# Patient Record
Sex: Female | Born: 1985 | Race: White | Hispanic: No | Marital: Married | Smoking: Never smoker
Health system: Southern US, Community
[De-identification: ages and names within clinical notes are randomized; demographics above are authoritative.]

## PROBLEM LIST (undated history)

## (undated) DIAGNOSIS — K469 Unspecified abdominal hernia without obstruction or gangrene: Secondary | ICD-10-CM

## (undated) DIAGNOSIS — G43909 Migraine, unspecified, not intractable, without status migrainosus: Secondary | ICD-10-CM

## (undated) DIAGNOSIS — R51 Headache: Secondary | ICD-10-CM

## (undated) DIAGNOSIS — R519 Headache, unspecified: Secondary | ICD-10-CM

## (undated) DIAGNOSIS — K219 Gastro-esophageal reflux disease without esophagitis: Secondary | ICD-10-CM

## (undated) HISTORY — DX: Unspecified abdominal hernia without obstruction or gangrene: K46.9

## (undated) HISTORY — DX: Gastro-esophageal reflux disease without esophagitis: K21.9

---

## 2011-11-01 ENCOUNTER — Emergency Department: Payer: Self-pay | Admitting: Emergency Medicine

## 2011-11-01 LAB — COMPREHENSIVE METABOLIC PANEL
Albumin: 3.6 g/dL (ref 3.4–5.0)
Alkaline Phosphatase: 78 U/L (ref 50–136)
Anion Gap: 13 (ref 7–16)
BUN: 7 mg/dL (ref 7–18)
Bilirubin,Total: 0.3 mg/dL (ref 0.2–1.0)
Calcium, Total: 8.6 mg/dL (ref 8.5–10.1)
Chloride: 105 mmol/L (ref 98–107)
Co2: 21 mmol/L (ref 21–32)
Creatinine: 0.63 mg/dL (ref 0.60–1.30)
EGFR (African American): >60
EGFR (Non-African Amer.): >60
Glucose: 73 mg/dL (ref 65–99)
Osmolality: 274 (ref 275–301)
Potassium: 3.7 mmol/L (ref 3.5–5.1)
SGOT(AST): 16 U/L (ref 15–37)
SGPT (ALT): 16 U/L (ref 12–78)
Sodium: 139 mmol/L (ref 136–145)
Total Protein: 7.5 g/dL (ref 6.4–8.2)

## 2011-11-01 LAB — CBC
HCT: 40.4 % (ref 35.0–47.0)
HGB: 13.7 g/dL (ref 12.0–16.0)
MCH: 30 pg (ref 26.0–34.0)
MCHC: 33.8 g/dL (ref 32.0–36.0)
MCV: 89 fL (ref 80–100)
Platelet: 247 10*3/uL (ref 150–440)
RBC: 4.56 10*6/uL (ref 3.80–5.20)
RDW: 13.2 % (ref 11.5–14.5)
WBC: 16.1 10*3/uL — ABNORMAL HIGH (ref 3.6–11.0)

## 2011-11-01 LAB — URINALYSIS, COMPLETE
Bacteria: NONE SEEN
Nitrite: NEGATIVE
RBC,UR: <1 /HPF (ref 0–5)
Specific Gravity: 1.028 (ref 1.003–1.030)
WBC UR: 17 /HPF (ref 0–5)

## 2011-11-01 LAB — HCG, QUANTITATIVE, PREGNANCY: Beta Hcg, Quant.: 78073 m[IU]/mL — ABNORMAL HIGH

## 2011-11-01 LAB — PREGNANCY, URINE: Pregnancy Test, Urine: POSITIVE m[IU]/mL

## 2011-11-03 LAB — URINE CULTURE

## 2012-01-19 DIAGNOSIS — K469 Unspecified abdominal hernia without obstruction or gangrene: Secondary | ICD-10-CM

## 2012-01-19 HISTORY — DX: Unspecified abdominal hernia without obstruction or gangrene: K46.9

## 2012-03-04 ENCOUNTER — Ambulatory Visit: Payer: Self-pay | Admitting: Emergency Medicine

## 2012-05-18 HISTORY — PX: TUBAL LIGATION: SHX77

## 2012-05-25 ENCOUNTER — Inpatient Hospital Stay: Payer: Self-pay | Admitting: Obstetrics & Gynecology

## 2012-05-25 LAB — CBC WITH DIFFERENTIAL/PLATELET
Basophil #: 0 10*3/uL (ref 0.0–0.1)
Eosinophil #: 0.1 10*3/uL (ref 0.0–0.7)
Eosinophil %: 0.5 %
HGB: 10.3 g/dL — ABNORMAL LOW (ref 12.0–16.0)
Lymphocyte #: 2.4 10*3/uL (ref 1.0–3.6)
MCH: 29.1 pg (ref 26.0–34.0)
MCHC: 33.2 g/dL (ref 32.0–36.0)
Neutrophil #: 12.6 10*3/uL — ABNORMAL HIGH (ref 1.4–6.5)
Platelet: 163 10*3/uL (ref 150–440)
WBC: 16.5 10*3/uL — ABNORMAL HIGH (ref 3.6–11.0)

## 2012-05-29 LAB — PATHOLOGY REPORT

## 2012-08-22 ENCOUNTER — Encounter: Payer: Self-pay | Admitting: *Deleted

## 2012-09-12 ENCOUNTER — Ambulatory Visit (INDEPENDENT_AMBULATORY_CARE_PROVIDER_SITE_OTHER): Payer: Medicaid Other | Admitting: General Surgery

## 2012-09-12 ENCOUNTER — Encounter: Payer: Self-pay | Admitting: General Surgery

## 2012-09-12 VITALS — BP 130/74 | HR 72 | Resp 13 | Ht 64.0 in | Wt 222.0 lb

## 2012-09-12 DIAGNOSIS — K429 Umbilical hernia without obstruction or gangrene: Secondary | ICD-10-CM

## 2012-09-12 NOTE — Patient Instructions (Addendum)
Hernia, Surgical Repair A hernia occurs when an internal organ pushes out through a weak spot in the belly (abdominal) wall muscles. Hernias commonly occur in the groin and around the navel. Hernias often can be pushed back into place (reduced). Most hernias tend to get worse over time. Problems occur when abdominal contents get stuck in the opening (incarcerated hernia). The blood supply gets cut off (strangulated hernia). This is an emergency and needs surgery. Otherwise, hernia repair can be an elective procedure. This means you can schedule this at your convenience when an emergency is not present. Because complications can occur, if you decide to repair the hernia, it is best to do it soon. When it becomes an emergency procedure, there is increased risk of complications after surgery. CAUSES   Heavy lifting.  Obesity.  Prolonged coughing.  Straining to move your bowels.  Hernias can also occur through a cut (incision) by a surgeonafter an abdominal operation. HOME CARE INSTRUCTIONS Before the repair:  Bed rest is not required. You may continue your normal activities, but avoid heavy lifting (more than 10 pounds) or straining. Cough gently. If you are a smoker, it is best to stop. Even the best hernia repair can break down with the continual strain of coughing.  Do not wear anything tight over your hernia. Do not try to keep it in with an outside bandage or truss. These can damage abdominal contents if they are trapped in the hernia sac.  Eat a normal diet. Avoid constipation. Straining over long periods of time to have a bowel movement will increase hernia size. It also can breakdown repairs. If you cannot do this with diet alone, laxatives or stool softeners may be used. PRIOR TO SURGERY, SEEK IMMEDIATE MEDICAL CARE IF: You have problems (symptoms) of a trapped (incarcerated) hernia. Symptoms include:  An oral temperature above 102 F (38.9 C) develops, or as your caregiver  suggests.  Increasing abdominal pain.  Feeling sick to your stomach(nausea) and vomiting.  You stop passing gas or stool.  The hernia is stuck outside the abdomen, looks discolored, feels hard, or is tender.  You have any changes in your bowel habits or in the hernia that is unusual for you. LET YOUR CAREGIVERS KNOW ABOUT THE FOLLOWING:  Allergies.  Medications taken including herbs, eye drops, over the counter medications, and creams.  Use of steroids (by mouth or creams).  Family or personal history of problems with anesthetics or Novocaine.  Possibility of pregnancy, if this applies.  Personal history of blood clots (thrombophlebitis).  Family or personal history of bleeding or blood problems.  Previous surgery.  Other health problems. BEFORE THE PROCEDURE You should be present 1 hour prior to your procedure, or as directed by your caregiver.  AFTER THE PROCEDURE After surgery, you will be taken to the recovery area. A nurse will watch and check your progress there. Once you are awake, stable, and taking fluids well, you will be allowed to go home as long as there are no problems. Once home, an ice pack (wrapped in a light towel) applied to your operative site may help with discomfort. It may also keep the swelling down. Do not lift anything heavier than 10 pounds (4.55 kilograms). Take showers not baths. Do not drive while taking narcotics. Follow instructions as suggested by your caregiver.  SEEK IMMEDIATE MEDICAL CARE IF: After surgery:  There is redness, swelling, or increasing pain in the wound.  There is pus coming from the wound.  There is  drainage from a wound lasting longer than 1 day.  An unexplained oral temperature above 102 F (38.9 C) develops.  You notice a foul smell coming from the wound or dressing.  There is a breaking open of a wound (edged not staying together) after the sutures have been removed.  You notice increasing pain in the shoulders  (shoulder strap areas).  You develop dizzy episodes or fainting while standing.  You develop persistent nausea or vomiting.  You develop a rash.  You have difficulty breathing.  You develop any reaction or side effects to medications given. MAKE SURE YOU:   Understand these instructions.  Will watch your condition.  Will get help right away if you are not doing well or get worse. Document Released: 06/30/2000 Document Revised: 03/29/2011 Document Reviewed: 05/23/2007 [facility] Patient Information 2014 [place], [place].  Outpatient surgery at [facility]  Patient's surgery has been scheduled for 10-03-12 at [facility].

## 2012-09-12 NOTE — Progress Notes (Signed)
Patient ID: Sonia Carpenter, female   DOB: Jun 06, 1985, 27 y.o.   MRN: [id number]  Chief Complaint  Patient presents with  . Abdominal Pain    umbilical hernia    HPI Sonia Carpenter is a 27 y.o. female.  Patient here today for an evaluation of a umbilical hernia.  States that they have noticed it for about 2 months.  It does seem to be causing some abdominal pain and "it sticks out".  No nausea, vomiting, constipation or diarrhea noted. States about one month ago she had one day of nausea and vomiting, none since. 3mos ago after childbirth she had lap/tubal ligation. HPI  Past Medical History  Diagnosis Date  . GERD (gastroesophageal reflux disease)   . Hernia 2014    Past Surgical History  Procedure Laterality Date  . Tubal ligation   May 2014    History reviewed. No pertinent family history.  Social History History  Substance Use Topics  . Smoking status: Never Smoker   . Smokeless tobacco: Not on file  . Alcohol Use: Yes     Comment: occasionally    No Known Allergies  No current outpatient prescriptions on file.   No current facility-administered medications for this visit.    Review of Systems Review of Systems  Constitutional: Negative.   Respiratory: Negative.   Cardiovascular: Negative.     Blood pressure 130/74, pulse 72, resp. rate 13, height 5\' 4"  (1.626 m), weight 222 lb (100.699 kg), last menstrual period 06/18/2012.  Physical Exam Physical Exam  Constitutional: She is oriented to person, place, and time. She appears well-developed and well-nourished.  Eyes: Conjunctivae are normal.  Neck: Neck supple.  Cardiovascular: Normal rate and regular rhythm.   Pulmonary/Chest: Effort normal and breath sounds normal.  Abdominal: Soft. There is tenderness. A hernia (medium size umbilical hernia with tenderness and is reducible. ) is present.  Lymphadenopathy:    She has no cervical adenopathy.  Neurological: She is alert and oriented to person, place, and  time.  Skin: Skin is warm and dry.    Data Reviewed none  Assessment    Stable as above.    Plan    Repair umbilical hernia.    Patient's surgery has been scheduled for 10-03-12 at [facility].    SANKAR,SEEPLAPUTHUR G 09/12/2012, 8:50 PM

## 2012-09-20 ENCOUNTER — Other Ambulatory Visit: Payer: Self-pay | Admitting: General Surgery

## 2012-09-20 DIAGNOSIS — K429 Umbilical hernia without obstruction or gangrene: Secondary | ICD-10-CM

## 2012-10-03 ENCOUNTER — Ambulatory Visit: Payer: Self-pay | Admitting: General Surgery

## 2012-10-03 DIAGNOSIS — K429 Umbilical hernia without obstruction or gangrene: Secondary | ICD-10-CM

## 2012-10-03 HISTORY — PX: HERNIA REPAIR: SHX51

## 2012-10-04 ENCOUNTER — Encounter: Payer: Self-pay | Admitting: General Surgery

## 2012-10-12 ENCOUNTER — Encounter: Payer: Self-pay | Admitting: General Surgery

## 2012-10-12 ENCOUNTER — Ambulatory Visit: Payer: Self-pay | Admitting: General Surgery

## 2012-10-12 VITALS — BP 124/62 | HR 76 | Resp 14 | Ht 64.0 in | Wt 224.0 lb

## 2012-10-12 DIAGNOSIS — K429 Umbilical hernia without obstruction or gangrene: Secondary | ICD-10-CM

## 2012-10-12 NOTE — Patient Instructions (Addendum)
Advance activity as tolerated, back to normal. Follow up in 5 weeks.

## 2012-10-12 NOTE — Progress Notes (Signed)
This is an 27 year old female here today for her post op laproscopic umbilical hernia repair with Physio mesh done on 10/03/12. Patient states she is doing well, just little sore.  Abdomen soft, port sites are healing well, repair remains intact. No signs of infection.

## 2012-10-12 NOTE — Progress Notes (Deleted)
Subjective:     Patient ID: Sonia Carpenter, female   DOB: 07-29-1985, 27 y.o.   MRN: [id number]  HPI Patient here today for postop visit umbilical hernia repair   Review of Systems  Constitutional: Negative.   Respiratory: Negative.   Cardiovascular: Negative.        Objective:   Physical Exam     Assessment:     ***    Plan:     ***

## 2012-11-15 ENCOUNTER — Ambulatory Visit: Payer: Medicaid Other | Admitting: General Surgery

## 2012-12-25 ENCOUNTER — Ambulatory Visit: Payer: Self-pay | Admitting: General Surgery

## 2013-01-08 ENCOUNTER — Ambulatory Visit: Payer: Self-pay | Admitting: General Surgery

## 2013-11-19 ENCOUNTER — Encounter: Payer: Self-pay | Admitting: General Surgery

## 2014-05-10 NOTE — Op Note (Signed)
PATIENT NAME:  [name], Sonia N MR#:  [id number] DATE OF BIRTH:  1986/01/10  DATE OF PROCEDURE:  05/26/2012  PREOPERATIVE DIAGNOSIS: Undesired fertility.  POSTOPERATIVE DIAGNOSIS:   Undesired fertility.  PROCEDURE PERFORMED:  Postpartum bilateral tubal ligation, Pomeroy method.   SURGEON: Verlin GrillsEryn Stansbury Clipp, M.D.   ASSISTANT:  Vena AustriaAndreas Staebler, MD  ANESTHESIA:  General.  COMPLICATIONS: None.   ESTIMATED BLOOD LOSS: Minimal.   INTRAVENOUS FLUIDS:  500 mL crystalloid.   SPECIMEN: Portion of right and left fallopian tubes sent to pathology.   INDICATIONS:  A 29 year old multiparous patient with undesired fertility postpartum day one.   FINDINGS: Normal uterus and normal tubes bilaterally.   PROCEDURE IN DETAIL:  The patient was taken to the Operating Room where she was given anesthesia via general.  She was prepped and draped in standard sterile fashion, laid supine on the table and a vertical incision was made infraumbilically using the knife. The patient had a small umbilical hernia and the hernia sac was entered without complication and ensuring no injury to contents of the hernia sac.  The incision was carried down sharply with the knife to the level of the fascia. The fascia was entered with Mayo scissors. The peritoneum was identified and elevated using hemostats and entered with Metzenbaum scissors. Next, Army-Navy retractors were placed into the abdomen and a mini lap was used to deflect bowel and omentum superiorly. The patient was then planed to the left side for visualization of the right tube.  It was followed out to the fimbria.  A mid isthmic portion of the right tube was elevated using a Babcock clamp, doubly ligated with plain gut suture and cut with Metzenbaum scissors. The portion of this tube was then sent to pathology for confirmation.  The mini lap was then removed and the patient was planed to the opposite side mini lap was replaced again to deflect bowel and omentum  superiorly and the above procedure was carried out again on the left side. Hemostasis was noted to be excellent and bilateral tubal ostia were visualized on both sides, both proximally and distally. Next, all instruments were removed. The mini lap was removed from the abdomen. The fascia was identified and closed with Vicryl stitch in a figure-of-eight fashion followed by closure of the skin using Monocryl. All counts were correct x 2. The patient tolerated the procedure well and was taken to the PACU in stable condition.     ____________________________ Ali LoweEryn K. Garnette GunnerStansbury Clipp, MD eks:ct D: 05/26/2012 14:51:59 ET T: 05/27/2012 08:04:48 ET JOB#: [id number]  cc: Weston SettleEryn K. Garnette GunnerStansbury Clipp, MD, <Dictator> Weston SettleERYN Lona KettleK STANSBURY CLIPP MD ELECTRONICALLY SIGNED 05/31/2012 5:35

## 2014-05-10 NOTE — Op Note (Signed)
PATIENT NAME:  Sonia Carpenter, Sonia Carpenter MR#:  [id number] DATE OF BIRTH:  08-04-1985  DATE OF PROCEDURE:  10/03/2012  PREOPERATIVE DIAGNOSIS: Umbilical hernia.   POSTOPERATIVE DIAGNOSIS: Umbilical hernia.   OPERATION: Repair of umbilical hernia laparoscopic with Physiomesh.   SURGEON: S.G. Evette CristalSankar, M.D.   ANESTHESIA: General.   COMPLICATIONS: None.   ESTIMATED BLOOD LOSS: Minimal.   DRAINS: None.   PROCEDURE: The patient was put to sleep in the supine position on the operating table. The abdomen was prepped and draped out as a sterile field. Timeout procedure was performed. A small incision was made in the left upper quadrant just below the costal margin and a Veress needle with InnerDyne sleeve was positioned in the peritoneal cavity and verified with the hanging drop method. Pneumoperitoneum was obtained followed with placement of a 10 mm port. This port was subsequently switched out to an 11 mm Xcel port. With an angled camera in place, the abdominal wall was visualized. Omentum was adherent to the umbilical hernia measuring a 2 cm defect. The rest of the structures in the underlying portion appeared to be uninvolved. The rest of the abdominal cavity appeared grossly normal. Additional two 5 mm ports were placed in the left side of the abdomen. The omental adhesion was taken down with cautery revealing a fascial defect that was well outlined and was a circular configuration. Overlying the skin, measurements were obtained to allow for a 4 to 5 cm margin all around and a 15 x 10 cm oval Physiomesh was brought up to the field. This was positioned in the peritoneal cavity, and it laid up against the abdominal wall with the center placed over the hernia. Using the secure strap tackers, it was tacked down all around the edges. Following this, 4 tiny stab incisions were made, one superior, one inferior and two lateral, and through these, using a spinal needle device, 0 Prolene stitches were placed to hold the  mesh in place. After these were tied down, the skin was freed from the sutures to prevent puckering. After ensuring everything was intact, the pneumoperitoneum was released. The fascial opening at the left upper quadrant was closed with 0 Vicryl. The skin incision on the port site was closed with subcuticular 4-0 Vicryl. All of the incisions and the tiny stab incisions were covered with Dermabond. The procedure was well tolerated. She was subsequently extubated and returned to the recovery room in stable condition.   ____________________________ S.Wynona LunaG. Wednesday Ericsson, MD sgs:gb D: 10/03/2012 17:29:34 ET T: 10/03/2012 20:43:59 ET JOB#: [id number]  cc: Timoteo ExposeS.G. Evette CristalSankar, MD, <Dictator> [facility] CenterEEPLAPUTH Wynona LunaG Carlette Palmatier MD ELECTRONICALLY SIGNED 10/13/2012 7:42

## 2014-05-28 NOTE — H&P (Signed)
L&D Evaluation:  History Expanded:  HPI 29 yo [id number] whsoe EDC = 5/28.  [facility].  Pt preetns with SROM at 7 pm 5/7 and contractions.   Blood Type (Maternal) AB positive   Group B Strep Results Maternal (Result >5wks must be treated as unknown) negative   Maternal HIV Negative   Maternal Syphilis Ab Nonreactive   Maternal Varicella Immune   Rubella Results (Maternal) immune   EDC 13-Jun-2012   Presents with leaking fluid   Patient's Medical History No Chronic Illness   Patient's Surgical History none   Medications Pre Natal Vitamins   Allergies NKDA   ROS:  ROS All systems were reviewed.  HEENT, CNS, GI, GU, Respiratory, CV, Renal and Musculoskeletal systems were found to be normal.   Exam:  Vital Signs stable   General other, labor   Mental Status clear   Chest clear   Heart normal sinus rhythm   Abdomen gravid, non-tender   Estimated Fetal Weight Average for gestational age   Pelvic 4-5   Mebranes Ruptured   Description clear   FHT normal rate with no decels   Impression:  Impression active labor   Electronic Signatures: Towana Badgerosenow, Philip J (MD)  (Signed 08-May-14 01:01)  Authored: L&D Evaluation   Last Updated: 08-May-14 01:01 by Towana Badgerosenow, Philip J (MD)

## 2014-11-21 ENCOUNTER — Encounter: Payer: Self-pay | Admitting: Internal Medicine

## 2014-11-25 ENCOUNTER — Encounter: Payer: Self-pay | Admitting: Internal Medicine

## 2014-11-25 ENCOUNTER — Ambulatory Visit (INDEPENDENT_AMBULATORY_CARE_PROVIDER_SITE_OTHER): Payer: Medicaid Other | Admitting: Internal Medicine

## 2014-11-25 VITALS — BP 118/78 | HR 80 | Ht 64.0 in | Wt 242.6 lb

## 2014-11-25 DIAGNOSIS — N912 Amenorrhea, unspecified: Secondary | ICD-10-CM | POA: Diagnosis not present

## 2014-11-25 DIAGNOSIS — R030 Elevated blood-pressure reading, without diagnosis of hypertension: Secondary | ICD-10-CM | POA: Diagnosis not present

## 2014-11-25 DIAGNOSIS — G43009 Migraine without aura, not intractable, without status migrainosus: Secondary | ICD-10-CM | POA: Diagnosis not present

## 2014-11-25 NOTE — Progress Notes (Signed)
Date:  11/25/2014   Name:  Sonia Carpenter   DOB:  04/29/1985   MRN:  [id number]   Chief Complaint: Hypertension Hypertension Chronicity: Patient took her blood pressure at the drugstore was 138/78. She tried to take her blood pressure at her friend's house and it wouldn't read reading. Associated symptoms include headaches. Pertinent negatives include no blurred vision, chest pain, palpitations or shortness of breath.  Migraine  This is a recurrent problem. The current episode started more than 1 year ago. The problem occurs intermittently. Progression since onset: once a week. The pain is located in the bilateral and parietal region. The pain quality is similar to prior headaches. The quality of the pain is described as throbbing. The pain is mild. Associated symptoms include nausea, photophobia, scalp tenderness and vomiting. Pertinent negatives include no blurred vision, coughing, eye watering or numbness. She has tried acetaminophen for the symptoms. Her past medical history is significant for hypertension.   Amenorrhea - patient states that she has not had a menstrual cycle in 2 years. She had her tubes tied after her 29-year-old son was born. She denies a history of PCOS. She has 3 children which were conceived without difficulty. She's not had a GYN exam in 2-1/2 years.    Review of Systems  Constitutional: Positive for appetite change. Negative for diaphoresis and fatigue.  Eyes: Positive for photophobia. Negative for blurred vision.  Respiratory: Negative for cough, chest tightness and shortness of breath.   Cardiovascular: Positive for leg swelling. Negative for chest pain and palpitations.  Gastrointestinal: Positive for nausea and vomiting.  Neurological: Positive for headaches. Negative for numbness.  Hematological: Negative for adenopathy.    Patient Active Problem List   Diagnosis Date Noted  . Umbilical hernia without mention of obstruction or gangrene 10/12/2012    Prior to  Admission medications   Not on File    No Known Allergies  Past Surgical History  Procedure Laterality Date  . Tubal ligation   May 2014  . Hernia repair  10-03-12    Social History  Substance Use Topics  . Smoking status: Never Smoker   . Smokeless tobacco: None  . Alcohol Use: 1.2 oz/week    2 Standard drinks or equivalent per week     Comment: occasionally     Medication list has been reviewed and updated.   Physical Exam  Constitutional: She is oriented to person, place, and time. She appears well-developed and well-nourished. No distress.  HENT:  Head: Normocephalic and atraumatic.  Eyes: Conjunctivae are normal. Right eye exhibits no discharge. Left eye exhibits no discharge. No scleral icterus.  Neck: Normal range of motion. Neck supple. No thyromegaly present.  Cardiovascular: Normal rate, regular rhythm and normal heart sounds.   No murmur heard. Pulmonary/Chest: Effort normal and breath sounds normal. No respiratory distress. She has no wheezes.  Musculoskeletal: Normal range of motion. She exhibits no edema.  Lymphadenopathy:    She has no cervical adenopathy.  Neurological: She is alert and oriented to person, place, and time. She has normal reflexes.  Skin: Skin is warm and dry. No rash noted.  Psychiatric: She has a normal mood and affect. Her behavior is normal. Thought content normal.  Nursing note and vitals reviewed.   BP 122/80 mmHg  Pulse 80  Ht 5\' 4"  (1.626 m)  Wt 242 lb 9.6 oz (110.043 kg)  BMI 41.62 kg/m2  Assessment and Plan: 1. Migraine without aura and without status migrainosus, not intractable Patient educated regarding  migraine treatment I recommend a caffeinated beverage along with Aleve/Advil and Tylenol at headache onset; may repeat later in the same day  2. Amenorrhea Needs GYN evaluation - Ambulatory referral to Gynecology  3. Elevated blood pressure (not hypertension) Patient is reassured that she does not appear to have  hypertension Continue low-sodium diet; diet and exercise for weight loss   Bari Edward, MD [facility]  11/25/2014

## 2015-01-07 ENCOUNTER — Emergency Department
Admission: EM | Admit: 2015-01-07 | Discharge: 2015-01-07 | Disposition: A | Discharge status: Home / Self Care | Payer: Medicaid Other | Attending: Emergency Medicine | Admitting: Emergency Medicine

## 2015-01-07 DIAGNOSIS — R110 Nausea: Secondary | ICD-10-CM | POA: Diagnosis not present

## 2015-01-07 DIAGNOSIS — R51 Headache: Secondary | ICD-10-CM | POA: Diagnosis present

## 2015-01-07 DIAGNOSIS — R519 Headache, unspecified: Secondary | ICD-10-CM

## 2015-01-07 MED ORDER — HYDROMORPHONE HCL 1 MG/ML IJ SOLN
1.0000 mg | Freq: Once | INTRAMUSCULAR | Status: AC
  Administered 2015-01-07: 1 mg via INTRAMUSCULAR
  Filled 2015-01-07: qty 1

## 2015-01-07 MED ORDER — PROMETHAZINE HCL 25 MG PO TABS: 25.0000 mg | ORAL_TABLET | Freq: Four times a day (QID) | ORAL | Status: DC | PRN

## 2015-01-07 MED ORDER — KETOROLAC TROMETHAMINE 60 MG/2ML IM SOLN
60.0000 mg | Freq: Once | INTRAMUSCULAR | Status: AC
Start: 2015-01-07 — End: 2015-01-07
  Administered 2015-01-07: 60 mg via INTRAMUSCULAR
  Filled 2015-01-07: qty 2

## 2015-01-07 MED ORDER — ONDANSETRON 8 MG PO TBDP
8.0000 mg | ORAL_TABLET | Freq: Once | ORAL | Status: AC
  Administered 2015-01-07: 8 mg via ORAL
  Filled 2015-01-07: qty 1

## 2015-01-07 MED ORDER — SUMATRIPTAN SUCCINATE 100 MG PO TABS: 100.0000 mg | ORAL_TABLET | Freq: Once | ORAL | Status: DC | PRN

## 2015-01-07 NOTE — Discharge Instructions (Signed)
General Headache Without Cause °A headache is pain or discomfort felt around the head or neck area. There are many causes and types of headaches. In some cases, the cause may not be found.  °HOME CARE  °Managing Pain °· Take over-the-counter and prescription medicines only as told by your doctor. °· Lie down in a dark, quiet room when you have a headache. °· If directed, apply ice to the head and neck area: °· Put ice in a plastic bag. °· Place a towel between your skin and the bag. °· Leave the ice on for 20 minutes, 2-3 times per day. °· Use a heating pad or hot shower to apply heat to the head and neck area as told by your doctor. °· Keep lights dim if bright lights bother you or make your headaches worse. °Eating and Drinking °· Eat meals on a regular schedule. °· Lessen how much alcohol you drink. °· Lessen how much caffeine you drink, or stop drinking caffeine. °General Instructions °· Keep all follow-up visits as told by your doctor. This is important. °· Keep a journal to find out if certain things bring on headaches. For example, write down: °· What you eat and drink. °· How much sleep you get. °· Any change to your diet or medicines. °· Relax by getting a massage or doing other relaxing activities. °· Lessen stress. °· Sit up straight. Do not tighten (tense) your muscles. °· Do not use tobacco products. This includes cigarettes, chewing tobacco, or e-cigarettes. If you need help quitting, ask your doctor. °· Exercise regularly as told by your doctor. °· Get enough sleep. This often means 7-9 hours of sleep. °GET HELP IF: °· Your symptoms are not helped by medicine. °· You have a headache that feels different than the other headaches. °· You feel sick to your stomach (nauseous) or you throw up (vomit). °· You have a fever. °GET HELP RIGHT AWAY IF:  °· Your headache becomes really bad. °· You keep throwing up. °· You have a stiff neck. °· You have trouble seeing. °· You have trouble speaking. °· You have  pain in the eye or ear. °· Your muscles are weak or you lose muscle control. °· You lose your balance or have trouble walking. °· You feel like you will pass out (faint) or you pass out. °· You have confusion. °  °This information is not intended to replace advice given to you by your health care provider. Make sure you discuss any questions you have with your health care provider. °  °Document Released: 10/14/2007 Document Revised: 09/25/2014 Document Reviewed: 04/29/2014 °Elsevier Interactive Patient Education ©2016 [employer] ° °Migraine Headache °A migraine headache is an intense, throbbing pain on one or both sides of your head. A migraine can last for 30 minutes to several hours. °CAUSES  °The exact cause of a migraine headache is not always known. However, a migraine may be caused when nerves in the brain become irritated and release chemicals that cause inflammation. This causes pain. °Certain things may also trigger migraines, such as: °· Alcohol. °· Smoking. °· Stress. °· Menstruation. °· Aged cheeses. °· Foods or drinks that contain nitrates, glutamate, aspartame, or tyramine. °· Lack of sleep. °· Chocolate. °· Caffeine. °· Hunger. °· Physical exertion. °· Fatigue. °· Medicines used to treat chest pain (nitroglycerine), birth control pills, estrogen, and some blood pressure medicines. °SIGNS AND SYMPTOMS °· Pain on one or both sides of your head. °· Pulsating or throbbing pain. °· Severe   pain that prevents daily activities. °· Pain that is aggravated by any physical activity. °· Nausea, vomiting, or both. °· Dizziness. °· Pain with exposure to bright lights, loud noises, or activity. °· General sensitivity to bright lights, loud noises, or smells. °Before you get a migraine, you may get warning signs that a migraine is coming (aura). An aura may include: °· Seeing flashing lights. °· Seeing bright spots, halos, or zigzag lines. °· Having tunnel vision or blurred vision. °· Having feelings of numbness  or tingling. °· Having trouble talking. °· Having muscle weakness. °DIAGNOSIS  °A migraine headache is often diagnosed based on: °· Symptoms. °· Physical exam. °· A CT scan or MRI of your head. These imaging tests cannot diagnose migraines, but they can help rule out other causes of headaches. °TREATMENT °Medicines may be given for pain and nausea. Medicines can also be given to help prevent recurrent migraines.  °HOME CARE INSTRUCTIONS °· Only take over-the-counter or prescription medicines for pain or discomfort as directed by your health care provider. The use of long-term narcotics is not recommended. °· Lie down in a dark, quiet room when you have a migraine. °· Keep a journal to find out what may trigger your migraine headaches. For example, write down: °¨ What you eat and drink. °¨ How much sleep you get. °¨ Any change to your diet or medicines. °· Limit alcohol consumption. °· Quit smoking if you smoke. °· Get 7-9 hours of sleep, or as recommended by your health care provider. °· Limit stress. °· Keep lights dim if bright lights bother you and make your migraines worse. °SEEK IMMEDIATE MEDICAL CARE IF:  °· Your migraine becomes severe. °· You have a fever. °· You have a stiff neck. °· You have vision loss. °· You have muscular weakness or loss of muscle control. °· You start losing your balance or have trouble walking. °· You feel faint or pass out. °· You have severe symptoms that are different from your first symptoms. °MAKE SURE YOU:  °· Understand these instructions. °· Will watch your condition. °· Will get help right away if you are not doing well or get worse. °  °This information is not intended to replace advice given to you by your health care provider. Make sure you discuss any questions you have with your health care provider. °  °Document Released: 01/04/2005 Document Revised: 01/25/2014 Document Reviewed: 09/11/2012 °Elsevier Interactive Patient Education ©2016 Elsevier Inc. ° °

## 2015-01-07 NOTE — ED Notes (Signed)
Pt in with co migraine since yest hx of migraines.  Also co bodyaches and chills.

## 2015-01-07 NOTE — ED Provider Notes (Signed)
[facility] Emergency Department Provider Note  ____________________________________________  Time seen: Approximately 8:01 PM  I have reviewed the triage vital signs and the nursing notes.   HISTORY  Chief Complaint Headache   HPI Sonia Carpenter is a 29 y.o. female is for evaluation of migraine headache. Patient states she has history of same headache started last night. Has not taken anything for her headache at all today.   Past Medical History  Diagnosis Date  . GERD (gastroesophageal reflux disease)   . Hernia 2014    Patient Active Problem List   Diagnosis Date Noted  . Migraine headache without aura 11/25/2014  . Amenorrhea 11/25/2014  . Umbilical hernia without mention of obstruction or gangrene 10/12/2012    Past Surgical History  Procedure Laterality Date  . Tubal ligation   May 2014  . Hernia repair  10-03-12    Current Outpatient Rx  Name  Route  Sig  Dispense  Refill  . promethazine (PHENERGAN) 25 MG tablet   Oral   Take 1 tablet (25 mg total) by mouth every 6 (six) hours as needed for nausea or vomiting.   15 tablet   0   . SUMAtriptan (IMITREX) 100 MG tablet   Oral   Take 1 tablet (100 mg total) by mouth once as needed for migraine. May repeat in 2 hours if headache persists or recurs.   30 tablet   0     Allergies Review of patient's allergies indicates no known allergies.  Family History  Problem Relation Age of Onset  . Diabetes Maternal Grandmother     Social History Social History  Substance Use Topics  . Smoking status: Never Smoker   . Smokeless tobacco: Not on file  . Alcohol Use: 1.2 oz/week    2 Standard drinks or equivalent per week     Comment: occasionally    Review of Systems Constitutional: No fever/chills Eyes: No visual changes. ENT: No sore throat. Cardiovascular: Denies chest pain. Respiratory: Denies shortness of breath. Gastrointestinal: No abdominal pain.  Positive nausea, no  vomiting.  No diarrhea.  No constipation. Genitourinary: Negative for dysuria. Musculoskeletal: Negative for back pain. Skin: Negative for rash. Neurological: Positive for headaches, negative for focal weakness or numbness.  10-point ROS otherwise negative.  ____________________________________________   PHYSICAL EXAM:  VITAL SIGNS: ED Triage Vitals  Enc Vitals Group     BP 01/07/15 1932 114/83 mmHg     Pulse Rate 01/07/15 1932 98     Resp 01/07/15 1932 18     Temp 01/07/15 1932 98.4 F (36.9 C)     Temp Source 01/07/15 1932 Oral     SpO2 01/07/15 1932 100 %     Weight 01/07/15 1932 235 lb (106.595 kg)     Height 01/07/15 1932 5\' 4"  (1.626 m)     Head Cir --      Peak Flow --      Pain Score 01/07/15 1933 8     Pain Loc --      Pain Edu? --      Excl. in GC? --     Constitutional: Alert and oriented. Well appearing and in no acute distress. Eyes: Conjunctivae are normal. PERRL. EOMI. Head: Atraumatic. Nose: No congestion/rhinnorhea. Mouth/Throat: Mucous membranes are moist.  Oropharynx non-erythematous. Neck: No stridor.  Supple full range of motion. Cardiovascular: Normal rate, regular rhythm. Grossly normal heart sounds.  Good peripheral circulation. Respiratory: Normal respiratory effort.  No retractions. Lungs CTAB. Gastrointestinal: Soft  and nontender. No distention. No abdominal bruits. No CVA tenderness. Musculoskeletal: No lower extremity tenderness nor edema.  No joint effusions. Neurologic:  Normal speech and language. No gross focal neurologic deficits are appreciated. No gait instability. Skin:  Skin is warm, dry and intact. No rash noted. Psychiatric: Mood and affect are normal. Speech and behavior are normal.  ____________________________________________   LABS (all labs ordered are listed, but only abnormal results are displayed)  Labs Reviewed - No data to display   PROCEDURES  Procedure(s) performed: None  Critical Care performed:  No  ____________________________________________   INITIAL IMPRESSION / ASSESSMENT AND PLAN / ED COURSE  Pertinent labs & imaging results that were available during my care of the patient were reviewed by me and considered in my medical decision making (see chart for details).  Acute migraine headache. Rx given with Dilaudid 1 mg Toradol 60 mg and Zofran 8 mg on ED. Patient states relief from her headache. Rx given for Imitrex 100 mg at onset. Repeat in 2 hours. Patient follow-up with PCP. ____________________________________________   FINAL CLINICAL IMPRESSION(S) / ED DIAGNOSES  Final diagnoses:  Headache disorder      Evangeline Dakin, PA-C 01/07/15 2324  Phineas Semen, MD 01/08/15 [phone]

## 2015-01-07 NOTE — ED Notes (Addendum)
Pt presents to ED with headache that started last night. Pt states light, sounds, movement bother her. Pt states no blurred vision. Pt is not currently seeing a neurologist for her hx of migraines. Pt's headache is in on the forehead and states her headache is throbbing.

## 2015-06-11 ENCOUNTER — Ambulatory Visit (INDEPENDENT_AMBULATORY_CARE_PROVIDER_SITE_OTHER): Payer: Medicaid Other | Admitting: Internal Medicine

## 2015-06-11 ENCOUNTER — Encounter: Payer: Self-pay | Admitting: Internal Medicine

## 2015-06-11 VITALS — BP 128/86 | HR 98 | Temp 98.1°F | Resp 16 | Ht 64.0 in | Wt 244.2 lb

## 2015-06-11 DIAGNOSIS — S81811A Laceration without foreign body, right lower leg, initial encounter: Secondary | ICD-10-CM

## 2015-06-11 DIAGNOSIS — Z6841 Body Mass Index (BMI) 40.0 and over, adult: Secondary | ICD-10-CM | POA: Diagnosis not present

## 2015-06-11 DIAGNOSIS — Z23 Encounter for immunization: Secondary | ICD-10-CM

## 2015-06-11 DIAGNOSIS — G43009 Migraine without aura, not intractable, without status migrainosus: Secondary | ICD-10-CM

## 2015-06-11 MED ORDER — SUMATRIPTAN SUCCINATE 100 MG PO TABS: 100.0000 mg | ORAL_TABLET | Freq: Once | ORAL | Status: DC

## 2015-06-11 NOTE — Patient Instructions (Signed)
Tdap Vaccine (Tetanus, Diphtheria and Pertussis): What You Need to Know 1. Why get vaccinated? Tetanus, diphtheria and pertussis are very serious diseases. Tdap vaccine can protect us from these diseases. And, Tdap vaccine given to pregnant women can protect newborn babies against pertussis. TETANUS (Lockjaw) is rare in the [place] today. It causes painful muscle tightening and stiffness, usually all over the body.  It can lead to tightening of muscles in the head and neck so you can't open your mouth, swallow, or sometimes even breathe. Tetanus kills about 1 out of 10 people who are infected even after receiving the best medical care. DIPHTHERIA is also rare in the [place] today. It can cause a thick coating to form in the back of the throat.  It can lead to breathing problems, heart failure, paralysis, and death. PERTUSSIS (Whooping Cough) causes severe coughing spells, which can cause difficulty breathing, vomiting and disturbed sleep.  It can also lead to weight loss, incontinence, and rib fractures. Up to 2 in 100 adolescents and 5 in 100 adults with pertussis are hospitalized or have complications, which could include pneumonia or death. These diseases are caused by bacteria. Diphtheria and pertussis are spread from person to person through secretions from coughing or sneezing. Tetanus enters the body through cuts, scratches, or wounds. Before vaccines, as many as 200,000 cases of diphtheria, 200,000 cases of pertussis, and hundreds of cases of tetanus, were reported in the [place] each year. Since vaccination began, reports of cases for tetanus and diphtheria have dropped by about 99% and for pertussis by about 80%. 2. Tdap vaccine Tdap vaccine can protect adolescents and adults from tetanus, diphtheria, and pertussis. One dose of Tdap is routinely given at age 11 or 12. People who did not get Tdap at that age should get it as soon as possible. Tdap is especially important  for healthcare professionals and anyone having close contact with a baby younger than 12 months. Pregnant women should get a dose of Tdap during every pregnancy, to protect the newborn from pertussis. Infants are most at risk for severe, life-threatening complications from pertussis. Another vaccine, called Td, protects against tetanus and diphtheria, but not pertussis. A Td booster should be given every 10 years. Tdap may be given as one of these boosters if you have never gotten Tdap before. Tdap may also be given after a severe cut or burn to prevent tetanus infection. Your doctor or the person giving you the vaccine can give you more information. Tdap may safely be given at the same time as other vaccines. 3. Some people should not get this vaccine  A person who has ever had a life-threatening allergic reaction after a previous dose of any diphtheria, tetanus or pertussis containing vaccine, OR has a severe allergy to any part of this vaccine, should not get Tdap vaccine. Tell the person giving the vaccine about any severe allergies.  Anyone who had coma or long repeated seizures within 7 days after a childhood dose of DTP or DTaP, or a previous dose of Tdap, should not get Tdap, unless a cause other than the vaccine was found. They can still get Td.  Talk to your doctor if you:  have seizures or another nervous system problem,  had severe pain or swelling after any vaccine containing diphtheria, tetanus or pertussis,  ever had a condition called Guillain-Barr Syndrome (GBS),  aren't feeling well on the day the shot is scheduled. 4. Risks With any medicine, including vaccines, there is   a chance of side effects. These are usually mild and go away on their own. Serious reactions are also possible but are rare. Most people who get Tdap vaccine do not have any problems with it. Mild problems following Tdap (Did not interfere with activities)  Pain where the shot was given (about 3 in 4  adolescents or 2 in 3 adults)  Redness or swelling where the shot was given (about 1 person in 5)  Mild fever of at least 100.4F (up to about 1 in 25 adolescents or 1 in 100 adults)  Headache (about 3 or 4 people in 10)  Tiredness (about 1 person in 3 or 4)  Nausea, vomiting, diarrhea, stomach ache (up to 1 in 4 adolescents or 1 in 10 adults)  Chills, sore joints (about 1 person in 10)  Body aches (about 1 person in 3 or 4)  Rash, swollen glands (uncommon) Moderate problems following Tdap (Interfered with activities, but did not require medical attention)  Pain where the shot was given (up to 1 in 5 or 6)  Redness or swelling where the shot was given (up to about 1 in 16 adolescents or 1 in 12 adults)  Fever over 102F (about 1 in 100 adolescents or 1 in 250 adults)  Headache (about 1 in 7 adolescents or 1 in 10 adults)  Nausea, vomiting, diarrhea, stomach ache (up to 1 or 3 people in 100)  Swelling of the entire arm where the shot was given (up to about 1 in 500). Severe problems following Tdap (Unable to perform usual activities; required medical attention)  Swelling, severe pain, bleeding and redness in the arm where the shot was given (rare). Problems that could happen after any vaccine:  People sometimes faint after a medical procedure, including vaccination. Sitting or lying down for about 15 minutes can help prevent fainting, and injuries caused by a fall. Tell your doctor if you feel dizzy, or have vision changes or ringing in the ears.  Some people get severe pain in the shoulder and have difficulty moving the arm where a shot was given. This happens very rarely.  Any medication can cause a severe allergic reaction. Such reactions from a vaccine are very rare, estimated at fewer than 1 in a million doses, and would happen within a few minutes to a few hours after the vaccination. As with any medicine, there is a very remote chance of a vaccine causing a serious  injury or death. The safety of vaccines is always being monitored. For more information, visit: www.cdc.gov/vaccinesafety/ 5. What if there is a serious problem? What should I look for?  Look for anything that concerns you, such as signs of a severe allergic reaction, very high fever, or unusual behavior.  Signs of a severe allergic reaction can include hives, swelling of the face and throat, difficulty breathing, a fast heartbeat, dizziness, and weakness. These would usually start a few minutes to a few hours after the vaccination. What should I do?  If you think it is a severe allergic reaction or other emergency that can't wait, call 9-1-1 or get the person to the nearest hospital. Otherwise, call your doctor.  Afterward, the reaction should be reported to the Vaccine Adverse Event Reporting System (VAERS). Your doctor might file this report, or you can do it yourself through the VAERS web site at www.vaers.hhs.gov, or by calling 1-800-822-7967. VAERS does not give medical advice.  6. The National Vaccine Injury Compensation Program The National Vaccine Injury Compensation Program (  VICP) is a federal program that was created to compensate people who may have been injured by certain vaccines. Persons who believe they may have been injured by a vaccine can learn about the program and about filing a claim by calling 1-800-338-2382 or visiting the VICP website at www.hrsa.gov/vaccinecompensation. There is a time limit to file a claim for compensation. 7. How can I learn more?  Ask your doctor. He or she can give you the vaccine package insert or suggest other sources of information.  Call your local or state health department.  Contact the Centers for Disease Control and Prevention (CDC):  Call 1-800-232-4636 (1-800-CDC-INFO) or  Visit CDC's website at www.cdc.gov/vaccines CDC Tdap Vaccine VIS (03/13/13)   This information is not intended to replace advice given to you by your health care  provider. Make sure you discuss any questions you have with your health care provider.   Document Released: 07/06/2011 Document Revised: 01/25/2014 Document Reviewed: 04/18/2013 Elsevier Interactive Patient Education 2016 Elsevier Inc.  

## 2015-06-11 NOTE — Progress Notes (Signed)
Date:  06/11/2015   Name:  Sonia Montaneshley N Carpenter   DOB:  Jun 06, 1985   MRN:  [id number]   Chief Complaint: Leg Injury Laceration  The incident occurred more than 1 week ago (fell and cut anterior shin on a protruding nail outside). The laceration is located on the right leg. The laceration is 1 cm in size. The laceration mechanism was a nail. The pain is mild. The pain has been improving since onset. She reports no foreign bodies present. Her tetanus status is unknown.  Migraine  This is a recurrent problem. The current episode started more than 1 year ago. The problem occurs intermittently. The problem has been unchanged. The pain is located in the bilateral region. The pain quality is similar to prior headaches. Pertinent negatives include no fever. She has tried triptans for the symptoms. The treatment provided significant relief.  Weight gain - patient notes that she continues to gain weight.  She loves to eat and cook.  She has not been exercising.  She has tried several otc products like green tea, etc with no benefit.  Review of Systems  Constitutional: Negative for fever, chills and fatigue.  HENT: Negative for trouble swallowing.   Respiratory: Negative for chest tightness, shortness of breath and wheezing.   Musculoskeletal: Negative for arthralgias and neck stiffness.  Skin: Positive for wound.  Neurological: Positive for headaches.    Patient Active Problem List   Diagnosis Date Noted  . Migraine headache without aura 11/25/2014  . Amenorrhea 11/25/2014  . Umbilical hernia without mention of obstruction or gangrene 10/12/2012    Prior to Admission medications   Medication Sig Start Date End Date Taking? Authorizing Provider  SUMAtriptan (IMITREX) 100 MG tablet Take 1 tablet (100 mg total) by mouth once as needed for migraine. May repeat in 2 hours if headache persists or recurs. 01/07/15 01/07/16 Yes Evangeline Dakinharles M Beers, PA-C    No Known Allergies  Past Surgical History    Procedure Laterality Date  . Tubal ligation   May 2014  . Hernia repair  10-03-12    Social History  Substance Use Topics  . Smoking status: Never Smoker   . Smokeless tobacco: None  . Alcohol Use: 1.2 oz/week    2 Standard drinks or equivalent per week     Comment: occasionally     Medication list has been reviewed and updated.   Physical Exam  Constitutional: She is oriented to person, place, and time. She appears well-developed. No distress.  HENT:  Head: Normocephalic and atraumatic.  Pulmonary/Chest: Effort normal. No respiratory distress.  Musculoskeletal: Normal range of motion.  Neurological: She is alert and oriented to person, place, and time.  Skin: Skin is warm and dry. No rash noted.     Psychiatric: She has a normal mood and affect. Her behavior is normal. Thought content normal.    BP 128/86 mmHg  Pulse 98  Temp(Src) 98.1 F (36.7 C)  Resp 16  Ht 5\' 4"  (1.626 m)  Wt 244 lb 3.2 oz (110.768 kg)  BMI 41.90 kg/m2  SpO2 97%  LMP 01/19/2015 (Approximate)  Assessment and Plan: 1. Laceration of leg, right, initial encounter Keep area covered with TAO Will heal by secondary intent - Tdap vaccine greater than or equal to 7yo IM  2. Migraine without aura and without status migrainosus, not intractable Doing well on imitrex - will refill - SUMAtriptan (IMITREX) 100 MG tablet; Take 1 tablet (100 mg total) by mouth once. May repeat in  2 hours if headache persists or recurs.  Dispense: 12 tablet; Refill: 5  3. BMI 40.0-44.9, adult [facility]) Discussed making gradually dietary changes and limiting carbs and fats Begin regular exercise such as walking 30-45 minutes three times per week  Bari Edward, MD [facility]  06/11/2015

## 2016-01-16 ENCOUNTER — Encounter: Payer: Self-pay | Admitting: Internal Medicine

## 2016-01-16 ENCOUNTER — Ambulatory Visit (INDEPENDENT_AMBULATORY_CARE_PROVIDER_SITE_OTHER): Payer: Medicaid Other | Admitting: Internal Medicine

## 2016-01-16 VITALS — BP 118/82 | HR 93 | Temp 98.1°F | Ht 64.0 in | Wt 245.0 lb

## 2016-01-16 DIAGNOSIS — R030 Elevated blood-pressure reading, without diagnosis of hypertension: Secondary | ICD-10-CM | POA: Diagnosis not present

## 2016-01-16 DIAGNOSIS — L309 Dermatitis, unspecified: Secondary | ICD-10-CM | POA: Diagnosis not present

## 2016-01-16 MED ORDER — TRIAMCINOLONE ACETONIDE 0.1 % EX CREA: 1.0000 "application " | TOPICAL_CREAM | Freq: Three times a day (TID) | CUTANEOUS | 1 refills | Status: AC

## 2016-01-16 NOTE — Patient Instructions (Signed)
DASH Eating Plan DASH stands for "Dietary Approaches to Stop Hypertension." The DASH eating plan is a healthy eating plan that has been shown to reduce high blood pressure (hypertension). Additional health benefits may include reducing the risk of type 2 diabetes mellitus, heart disease, and stroke. The DASH eating plan may also help with weight loss. What do I need to know about the DASH eating plan? For the DASH eating plan, you will follow these general guidelines:  Choose foods with less than 150 milligrams of sodium per serving (as listed on the food label).  Use salt-free seasonings or herbs instead of table salt or sea salt.  Check with your health care provider or pharmacist before using salt substitutes.  Eat lower-sodium products. These are often labeled as "low-sodium" or "no salt added."  Eat fresh foods. Avoid eating a lot of canned foods.  Eat more vegetables, fruits, and low-fat dairy products.  Choose whole grains. Look for the word "whole" as the first word in the ingredient list.  Choose fish and skinless chicken or turkey more often than red meat. Limit fish, poultry, and meat to 6 oz (170 g) each day.  Limit sweets, desserts, sugars, and sugary drinks.  Choose heart-healthy fats.  Eat more home-cooked food and less restaurant, buffet, and fast food.  Limit fried foods.  Do not fry foods. Cook foods using methods such as baking, boiling, grilling, and broiling instead.  When eating at a restaurant, ask that your food be prepared with less salt, or no salt if possible. What foods can I eat? Seek help from a dietitian for individual calorie needs. Grains  Whole grain or whole wheat bread. Brown rice. Whole grain or whole wheat pasta. Quinoa, bulgur, and whole grain cereals. Low-sodium cereals. Corn or whole wheat flour tortillas. Whole grain cornbread. Whole grain crackers. Low-sodium crackers. Vegetables  Fresh or frozen vegetables (raw, steamed, roasted, or  grilled). Low-sodium or reduced-sodium tomato and vegetable juices. Low-sodium or reduced-sodium tomato sauce and paste. Low-sodium or reduced-sodium canned vegetables. Fruits  All fresh, canned (in natural juice), or frozen fruits. Meat and Other Protein Products  Ground beef (85% or leaner), grass-fed beef, or beef trimmed of fat. Skinless chicken or turkey. Ground chicken or turkey. Pork trimmed of fat. All fish and seafood. Eggs. Dried beans, peas, or lentils. Unsalted nuts and seeds. Unsalted canned beans. Dairy  Low-fat dairy products, such as skim or 1% milk, 2% or reduced-fat cheeses, low-fat ricotta or cottage cheese, or plain low-fat yogurt. Low-sodium or reduced-sodium cheeses. Fats and Oils  Tub margarines without trans fats. Light or reduced-fat mayonnaise and salad dressings (reduced sodium). Avocado. Safflower, olive, or canola oils. Natural peanut or almond butter. Other  Unsalted popcorn and pretzels. The items listed above may not be a complete list of recommended foods or beverages. Contact your dietitian for more options.  What foods are not recommended? Grains  White bread. White pasta. White rice. Refined cornbread. Bagels and croissants. Crackers that contain trans fat. Vegetables  Creamed or fried vegetables. Vegetables in a cheese sauce. Regular canned vegetables. Regular canned tomato sauce and paste. Regular tomato and vegetable juices. Fruits  Canned fruit in light or heavy syrup. Fruit juice. Meat and Other Protein Products  Fatty cuts of meat. Ribs, chicken wings, bacon, sausage, bologna, salami, chitterlings, fatback, hot dogs, bratwurst, and packaged luncheon meats. Salted nuts and seeds. Canned beans with salt. Dairy  Whole or 2% milk, cream, half-and-half, and cream cheese. Whole-fat or sweetened yogurt. Full-fat cheeses   or blue cheese. Nondairy creamers and whipped toppings. Processed cheese, cheese spreads, or cheese curds. Condiments  Onion and garlic  salt, seasoned salt, table salt, and sea salt. Canned and packaged gravies. Worcestershire sauce. Tartar sauce. Barbecue sauce. Teriyaki sauce. Soy sauce, including reduced sodium. Steak sauce. Fish sauce. Oyster sauce. Cocktail sauce. Horseradish. Ketchup and mustard. Meat flavorings and tenderizers. Bouillon cubes. Hot sauce. Tabasco sauce. Marinades. Taco seasonings. Relishes. Fats and Oils  Butter, stick margarine, lard, shortening, ghee, and bacon fat. Coconut, palm kernel, or palm oils. Regular salad dressings. Other  Pickles and olives. Salted popcorn and pretzels. The items listed above may not be a complete list of foods and beverages to avoid. Contact your dietitian for more information.  Where can I find more information? National Heart, Lung, and Blood Institute: www.nhlbi.nih.gov/health/health-topics/topics/dash/ This information is not intended to replace advice given to you by your health care provider. Make sure you discuss any questions you have with your health care provider. Document Released: 12/24/2010 Document Revised: 06/12/2015 Document Reviewed: 11/08/2012 Elsevier Interactive Patient Education  2017 Elsevier Inc.  

## 2016-01-16 NOTE — Progress Notes (Signed)
    Date:  01/16/2016   Name:  Sonia Carpenter   DOB:  1985/03/22   MRN:  [id number]   Chief Complaint: Hand Pain (Pt stated burning/itching between the fingers for 3 months.) Rash  This is a new problem. The current episode started more than 1 month ago. The problem is unchanged. The affected locations include the left fingers. The rash is characterized by blistering, pain, peeling and redness. She was exposed to nothing. Pertinent negatives include no fatigue or shortness of breath. Past treatments include antibiotic cream and anti-itch cream. The treatment provided no relief.      Review of Systems  Constitutional: Negative for chills, fatigue and unexpected weight change.  Respiratory: Negative for chest tightness and shortness of breath.   Cardiovascular: Negative for chest pain and palpitations.  Skin: Positive for rash.    Patient Active Problem List   Diagnosis Date Noted  . Migraine headache without aura 11/25/2014  . Amenorrhea 11/25/2014  . Umbilical hernia without mention of obstruction or gangrene 10/12/2012    Prior to Admission medications   Medication Sig Start Date End Date Taking? Authorizing Provider  SUMAtriptan (IMITREX) 100 MG tablet Take 1 tablet (100 mg total) by mouth once. May repeat in 2 hours if headache persists or recurs. 06/11/15 06/10/16 Yes Reubin MilanLaura H Gabriele Loveland, MD    No Known Allergies  Past Surgical History:  Procedure Laterality Date  . HERNIA REPAIR  10-03-12  . TUBAL LIGATION   May 2014    Social History  Substance Use Topics  . Smoking status: Never Smoker  . Smokeless tobacco: Not on file  . Alcohol use 1.2 oz/week    2 Standard drinks or equivalent per week     Comment: occasionally     Medication list has been reviewed and updated.   Physical Exam  Constitutional: She is oriented to person, place, and time. She appears well-developed. No distress.  HENT:  Head: Normocephalic and atraumatic.  Cardiovascular: Normal rate,  regular rhythm and normal heart sounds.   Pulmonary/Chest: Effort normal. No respiratory distress.  Musculoskeletal: Normal range of motion.  Neurological: She is alert and oriented to person, place, and time.  Skin: Skin is warm and dry. No rash noted.  Eczematous rash on left ring finger proximal phalanx and inner middle finger  Psychiatric: She has a normal mood and affect. Her behavior is normal. Thought content normal.  Nursing note and vitals reviewed.   BP 118/82   Pulse 93   Temp 98.1 F (36.7 C)   Ht 5\' 4"  (1.626 m)   Wt 245 lb (111.1 kg)   SpO2 98%   BMI 42.05 kg/m   Assessment and Plan: 1. Eczema, unspecified type Avoid excessive hand washing - triamcinolone cream (KENALOG) 0.1 %; Apply 1 application topically 3 (three) times daily.  Dispense: 30 g; Refill: 1  2. Elevated blood pressure reading DASH diet Monitor intermittently and return if concerns   Bari EdwardLaura Shonya Sumida, MD [facility]  01/16/2016

## 2016-02-03 ENCOUNTER — Encounter
Admission: RE | Admit: 2016-02-03 | Discharge: 2016-02-03 | Disposition: A | Discharge status: Home / Self Care | Payer: Medicaid Other | Source: Ambulatory Visit | Attending: Obstetrics & Gynecology | Admitting: Obstetrics & Gynecology

## 2016-02-03 DIAGNOSIS — R102 Pelvic and perineal pain: Secondary | ICD-10-CM | POA: Insufficient documentation

## 2016-02-03 DIAGNOSIS — N912 Amenorrhea, unspecified: Secondary | ICD-10-CM | POA: Diagnosis not present

## 2016-02-03 DIAGNOSIS — Z01812 Encounter for preprocedural laboratory examination: Secondary | ICD-10-CM | POA: Insufficient documentation

## 2016-02-03 HISTORY — DX: Headache, unspecified: R51.9

## 2016-02-03 HISTORY — DX: Headache: R51

## 2016-02-03 LAB — CBC
HCT: 39.1 % (ref 35.0–47.0)
Hemoglobin: 13.4 g/dL (ref 12.0–16.0)
MCH: 30.2 pg (ref 26.0–34.0)
MCHC: 34.2 g/dL (ref 32.0–36.0)
MCV: 88.1 fL (ref 80.0–100.0)
PLATELETS: 271 10*3/uL (ref 150–440)
RBC: 4.43 MIL/uL (ref 3.80–5.20)
RDW: 12.7 % (ref 11.5–14.5)
WBC: 9 10*3/uL (ref 3.6–11.0)

## 2016-02-03 NOTE — Patient Instructions (Signed)
Your procedure is scheduled on: Tuesday 02/10/16 Report to DAY SURGERY. 2ND FLOOR MEDICAL MALL ENTRANCE. To find out your arrival time please call [phone] between 1PM - 3PM on Monday 02/09/16.  Remember: Instructions that are not followed completely may result in serious medical risk, up to and including death, or upon the discretion of your surgeon and anesthesiologist your surgery may need to be rescheduled.    __X__ 1. Do not eat food or drink liquids after midnight. No gum chewing or hard candies.     __X__ 2. No Alcohol for 24 hours before or after surgery.   ____ 3. Bring all medications with you on the day of surgery if instructed.    __X__ 4. Notify your doctor if there is any change in your medical condition     (cold, fever, infections).             ___X__5. No smoking within 24 hours of your surgery.     Do not wear jewelry, make-up, hairpins, clips or nail polish.  Do not wear lotions, powders, or perfumes.   Do not shave 48 hours prior to surgery. Men may shave face and neck.  Do not bring valuables to the hospital.    [facility] is not responsible for any belongings or valuables.               Contacts, dentures or bridgework may not be worn into surgery.  Leave your suitcase in the car. After surgery it may be brought to your room.  For patients admitted to the hospital, discharge time is determined by your                treatment team.   Patients discharged the day of surgery will not be allowed to drive home.   Please read over the following fact sheets that you were given:   Pain Booklet and MRSA Information   ____ Take these medicines the morning of surgery with A SIP OF WATER:    1. NONE  2.   3.   4.  5.  6.  ____ Fleet Enema (as directed)   __X__ Use CHG Soap as directed  ____ Use inhalers on the day of surgery  ____ Stop metformin 2 days prior to surgery    ____ Take 1/2 of usual insulin dose the night before surgery and none on the  morning of surgery.   ____ Stop Coumadin/Plavix/aspirin on   __X__ Stop Anti-inflammatories such as Advil, Aleve, Ibuprofen, Motrin, Naproxen, Naprosyn, Goodies,powder, or aspirin products.  OK to take Tylenol.   ____ Stop supplements until after surgery.    ____ Bring C-Pap to the hospital.

## 2016-02-09 MED ORDER — CEFOXITIN SODIUM-DEXTROSE 2-2.2 GM-% IV SOLR (PREMIX)
2.0000 g | Freq: Once | INTRAVENOUS | Status: AC
Start: 2016-02-09 — End: 2016-02-10
  Administered 2016-02-10: 2000 mg via INTRAVENOUS

## 2016-02-10 ENCOUNTER — Ambulatory Visit: Payer: Medicaid Other | Admitting: Anesthesiology

## 2016-02-10 ENCOUNTER — Encounter
Admission: RE | Disposition: A | Discharge status: Home / Self Care | Payer: Self-pay | Source: Ambulatory Visit | Attending: Obstetrics & Gynecology

## 2016-02-10 ENCOUNTER — Ambulatory Visit
Admission: RE | Admit: 2016-02-10 | Discharge: 2016-02-10 | Disposition: A | Discharge status: Home / Self Care | Payer: Medicaid Other | Source: Ambulatory Visit | Attending: Obstetrics & Gynecology | Admitting: Obstetrics & Gynecology

## 2016-02-10 ENCOUNTER — Encounter: Payer: Self-pay | Admitting: *Deleted

## 2016-02-10 DIAGNOSIS — N72 Inflammatory disease of cervix uteri: Secondary | ICD-10-CM | POA: Diagnosis not present

## 2016-02-10 DIAGNOSIS — Z9889 Other specified postprocedural states: Secondary | ICD-10-CM | POA: Insufficient documentation

## 2016-02-10 DIAGNOSIS — N912 Amenorrhea, unspecified: Secondary | ICD-10-CM | POA: Diagnosis not present

## 2016-02-10 DIAGNOSIS — R102 Pelvic and perineal pain: Secondary | ICD-10-CM | POA: Diagnosis present

## 2016-02-10 DIAGNOSIS — N838 Other noninflammatory disorders of ovary, fallopian tube and broad ligament: Secondary | ICD-10-CM | POA: Diagnosis not present

## 2016-02-10 DIAGNOSIS — G8929 Other chronic pain: Secondary | ICD-10-CM | POA: Diagnosis present

## 2016-02-10 DIAGNOSIS — N800 Endometriosis of uterus: Secondary | ICD-10-CM | POA: Insufficient documentation

## 2016-02-10 DIAGNOSIS — Z803 Family history of malignant neoplasm of breast: Secondary | ICD-10-CM | POA: Diagnosis not present

## 2016-02-10 DIAGNOSIS — Z6841 Body Mass Index (BMI) 40.0 and over, adult: Secondary | ICD-10-CM | POA: Diagnosis not present

## 2016-02-10 DIAGNOSIS — Z79899 Other long term (current) drug therapy: Secondary | ICD-10-CM | POA: Insufficient documentation

## 2016-02-10 DIAGNOSIS — Z8041 Family history of malignant neoplasm of ovary: Secondary | ICD-10-CM | POA: Diagnosis not present

## 2016-02-10 HISTORY — PX: LAPAROSCOPIC HYSTERECTOMY: SHX1926

## 2016-02-10 LAB — POCT PREGNANCY, URINE: Preg Test, Ur: NEGATIVE

## 2016-02-10 SURGERY — HYSTERECTOMY, TOTAL, LAPAROSCOPIC
Anesthesia: General | Wound class: Clean Contaminated

## 2016-02-10 MED ORDER — GLYCOPYRROLATE 0.2 MG/ML IJ SOLN
INTRAMUSCULAR | Status: AC
  Filled 2016-02-10: qty 1

## 2016-02-10 MED ORDER — HYDROMORPHONE HCL 1 MG/ML IJ SOLN
0.5000 mg | INTRAMUSCULAR | Status: DC | PRN
  Administered 2016-02-10 (×2): 0.5 mg via INTRAVENOUS

## 2016-02-10 MED ORDER — MIDAZOLAM HCL 2 MG/2ML IJ SOLN
INTRAMUSCULAR | Status: DC | PRN
  Administered 2016-02-10: 2 mg via INTRAVENOUS

## 2016-02-10 MED ORDER — OXYCODONE-ACETAMINOPHEN 5-325 MG PO TABS: 1.0000 | ORAL_TABLET | ORAL | 0 refills | Status: DC | PRN

## 2016-02-10 MED ORDER — DEXAMETHASONE SODIUM PHOSPHATE 10 MG/ML IJ SOLN
INTRAMUSCULAR | Status: DC | PRN
  Administered 2016-02-10: 5 mg via INTRAVENOUS

## 2016-02-10 MED ORDER — FAMOTIDINE 20 MG PO TABS
20.0000 mg | ORAL_TABLET | Freq: Once | ORAL | Status: AC
  Administered 2016-02-10: 20 mg via ORAL

## 2016-02-10 MED ORDER — HYDROMORPHONE HCL 1 MG/ML IJ SOLN
INTRAMUSCULAR | Status: AC
  Filled 2016-02-10: qty 1

## 2016-02-10 MED ORDER — ACETAMINOPHEN 650 MG RE SUPP
650.0000 mg | RECTAL | Status: DC | PRN
  Filled 2016-02-10: qty 1

## 2016-02-10 MED ORDER — LIDOCAINE HCL (CARDIAC) 20 MG/ML IV SOLN
INTRAVENOUS | Status: DC | PRN
  Administered 2016-02-10: 100 mg via INTRAVENOUS

## 2016-02-10 MED ORDER — LACTATED RINGERS IV SOLN
INTRAVENOUS | Status: DC
  Administered 2016-02-10 (×2): via INTRAVENOUS

## 2016-02-10 MED ORDER — FENTANYL CITRATE (PF) 100 MCG/2ML IJ SOLN
50.0000 ug | Freq: Once | INTRAMUSCULAR | Status: AC
  Administered 2016-02-10: 50 ug via INTRAVENOUS

## 2016-02-10 MED ORDER — BUPIVACAINE HCL (PF) 0.5 % IJ SOLN
INTRAMUSCULAR | Status: AC
  Filled 2016-02-10: qty 30

## 2016-02-10 MED ORDER — FENTANYL CITRATE (PF) 100 MCG/2ML IJ SOLN
INTRAMUSCULAR | Status: AC
  Filled 2016-02-10: qty 2

## 2016-02-10 MED ORDER — OXYCODONE-ACETAMINOPHEN 5-325 MG PO TABS
1.0000 | ORAL_TABLET | ORAL | Status: DC | PRN
  Administered 2016-02-10: 1 via ORAL

## 2016-02-10 MED ORDER — FENTANYL CITRATE (PF) 100 MCG/2ML IJ SOLN
INTRAMUSCULAR | Status: DC
Start: 2016-02-10 — End: 2016-02-10
  Filled 2016-02-10: qty 2

## 2016-02-10 MED ORDER — SUGAMMADEX SODIUM 500 MG/5ML IV SOLN
INTRAVENOUS | Status: DC | PRN
  Administered 2016-02-10: 500 mg via INTRAVENOUS

## 2016-02-10 MED ORDER — FENTANYL CITRATE (PF) 100 MCG/2ML IJ SOLN
25.0000 ug | INTRAMUSCULAR | Status: AC | PRN
  Administered 2016-02-10 (×6): 25 ug via INTRAVENOUS

## 2016-02-10 MED ORDER — BUPIVACAINE HCL (PF) 0.5 % IJ SOLN
INTRAMUSCULAR | Status: DC | PRN
  Administered 2016-02-10: 10 mL

## 2016-02-10 MED ORDER — ROCURONIUM BROMIDE 100 MG/10ML IV SOLN
INTRAVENOUS | Status: DC | PRN
  Administered 2016-02-10: 10 mg via INTRAVENOUS
  Administered 2016-02-10: 20 mg via INTRAVENOUS
  Administered 2016-02-10: 40 mg via INTRAVENOUS

## 2016-02-10 MED ORDER — ONDANSETRON HCL 4 MG/2ML IJ SOLN: 4.0000 mg | Freq: Once | INTRAMUSCULAR | Status: DC | PRN

## 2016-02-10 MED ORDER — FENTANYL CITRATE (PF) 100 MCG/2ML IJ SOLN
INTRAMUSCULAR | Status: DC | PRN
  Administered 2016-02-10 (×2): 50 ug via INTRAVENOUS

## 2016-02-10 MED ORDER — SUGAMMADEX SODIUM 500 MG/5ML IV SOLN
INTRAVENOUS | Status: AC
  Filled 2016-02-10: qty 5

## 2016-02-10 MED ORDER — ROCURONIUM BROMIDE 50 MG/5ML IV SOSY
PREFILLED_SYRINGE | INTRAVENOUS | Status: AC
  Filled 2016-02-10: qty 5

## 2016-02-10 MED ORDER — CEFOXITIN SODIUM-DEXTROSE 2-2.2 GM-% IV SOLR (PREMIX)
INTRAVENOUS | Status: AC
  Administered 2016-02-10: 2000 mg via INTRAVENOUS
  Filled 2016-02-10: qty 50

## 2016-02-10 MED ORDER — ONDANSETRON HCL 4 MG/2ML IJ SOLN
INTRAMUSCULAR | Status: DC | PRN
  Administered 2016-02-10: 4 mg via INTRAVENOUS

## 2016-02-10 MED ORDER — ONDANSETRON HCL 4 MG/2ML IJ SOLN
INTRAMUSCULAR | Status: AC
  Filled 2016-02-10: qty 2

## 2016-02-10 MED ORDER — DEXAMETHASONE SODIUM PHOSPHATE 10 MG/ML IJ SOLN
INTRAMUSCULAR | Status: AC
  Filled 2016-02-10: qty 1

## 2016-02-10 MED ORDER — PROPOFOL 10 MG/ML IV BOLUS
INTRAVENOUS | Status: AC
  Filled 2016-02-10: qty 20

## 2016-02-10 MED ORDER — ACETAMINOPHEN 325 MG PO TABS: 650.0000 mg | ORAL_TABLET | ORAL | Status: DC | PRN

## 2016-02-10 MED ORDER — LIDOCAINE HCL (PF) 2 % IJ SOLN
INTRAMUSCULAR | Status: AC
  Filled 2016-02-10: qty 2

## 2016-02-10 MED ORDER — FAMOTIDINE 20 MG PO TABS
ORAL_TABLET | ORAL | Status: AC
  Filled 2016-02-10: qty 1

## 2016-02-10 MED ORDER — ACETAMINOPHEN 10 MG/ML IV SOLN
INTRAVENOUS | Status: DC | PRN
  Administered 2016-02-10: 1000 mg via INTRAVENOUS

## 2016-02-10 MED ORDER — OXYCODONE-ACETAMINOPHEN 5-325 MG PO TABS
ORAL_TABLET | ORAL | Status: AC
  Filled 2016-02-10: qty 1

## 2016-02-10 MED ORDER — MORPHINE SULFATE (PF) 4 MG/ML IV SOLN: 1.0000 mg | INTRAVENOUS | Status: DC | PRN

## 2016-02-10 MED ORDER — MIDAZOLAM HCL 2 MG/2ML IJ SOLN
INTRAMUSCULAR | Status: AC
  Filled 2016-02-10: qty 2

## 2016-02-10 MED ORDER — ACETAMINOPHEN NICU IV SYRINGE 10 MG/ML
INTRAVENOUS | Status: AC
  Filled 2016-02-10: qty 1

## 2016-02-10 MED ORDER — KETOROLAC TROMETHAMINE 30 MG/ML IJ SOLN
30.0000 mg | Freq: Four times a day (QID) | INTRAMUSCULAR | Status: DC
  Filled 2016-02-10 (×5): qty 1

## 2016-02-10 MED ORDER — PROPOFOL 10 MG/ML IV BOLUS
INTRAVENOUS | Status: DC | PRN
  Administered 2016-02-10: 150 mg via INTRAVENOUS

## 2016-02-10 MED ORDER — KETOROLAC TROMETHAMINE 30 MG/ML IJ SOLN
INTRAMUSCULAR | Status: DC | PRN
  Administered 2016-02-10: 30 mg via INTRAVENOUS

## 2016-02-10 MED ORDER — SUGAMMADEX SODIUM 200 MG/2ML IV SOLN
INTRAVENOUS | Status: AC
  Filled 2016-02-10: qty 2

## 2016-02-10 SURGICAL SUPPLY — 49 items
APPLICATOR ARISTA FLEXITIP XL (MISCELLANEOUS) ×3 IMPLANT
BAG URO DRAIN 2000ML W/SPOUT (MISCELLANEOUS) ×3 IMPLANT
BLADE SURG SZ11 CARB STEEL (BLADE) ×3 IMPLANT
CANISTER SUCT 1200ML W/VALVE (MISCELLANEOUS) ×3 IMPLANT
CATH FOLEY 2WAY  5CC 16FR (CATHETERS) ×2
CATH URTH 16FR FL 2W BLN LF (CATHETERS) ×1 IMPLANT
CHLORAPREP W/TINT 26ML (MISCELLANEOUS) ×3 IMPLANT
DEFOGGER SCOPE WARMER CLEARIFY (MISCELLANEOUS) ×3 IMPLANT
DEVICE SUTURE ENDOST 10MM (ENDOMECHANICALS) ×6 IMPLANT
DRAPE CAMERA CLOSED 9X96 (DRAPES) ×6 IMPLANT
DRSG TEGADERM 2-3/8X2-3/4 SM (GAUZE/BANDAGES/DRESSINGS) IMPLANT
ENDOSTITCH 0 SINGLE 48 (SUTURE) ×18 IMPLANT
GAUZE SPONGE NON-WVN 2X2 STRL (MISCELLANEOUS) IMPLANT
GLOVE BIO SURGEON STRL SZ8 (GLOVE) ×15 IMPLANT
GLOVE INDICATOR 8.0 STRL GRN (GLOVE) ×9 IMPLANT
GOWN STRL REUS W/ TWL LRG LVL3 (GOWN DISPOSABLE) ×2 IMPLANT
GOWN STRL REUS W/ TWL XL LVL3 (GOWN DISPOSABLE) ×2 IMPLANT
GOWN STRL REUS W/TWL LRG LVL3 (GOWN DISPOSABLE) ×4
GOWN STRL REUS W/TWL XL LVL3 (GOWN DISPOSABLE) ×4
HEMOSTAT ARISTA ABSORB 3G PWDR (MISCELLANEOUS) ×3 IMPLANT
IRRIGATION STRYKERFLOW (MISCELLANEOUS) ×1 IMPLANT
IRRIGATOR STRYKERFLOW (MISCELLANEOUS) ×3
IV LACTATED RINGERS 1000ML (IV SOLUTION) ×3 IMPLANT
KIT PINK PAD W/HEAD ARE REST (MISCELLANEOUS) ×3
KIT PINK PAD W/HEAD ARM REST (MISCELLANEOUS) ×1 IMPLANT
KIT RM TURNOVER CYSTO AR (KITS) ×3 IMPLANT
LABEL OR SOLS (LABEL) ×3 IMPLANT
LIQUID BAND (GAUZE/BANDAGES/DRESSINGS) ×3 IMPLANT
MANIPULATOR VCARE LG CRV RETR (MISCELLANEOUS) IMPLANT
MANIPULATOR VCARE SML CRV RETR (MISCELLANEOUS) IMPLANT
MANIPULATOR VCARE STD CRV RETR (MISCELLANEOUS) ×3 IMPLANT
NEEDLE VERESS 14GA 120MM (NEEDLE) ×3 IMPLANT
NS IRRIG 500ML POUR BTL (IV SOLUTION) ×3 IMPLANT
OCCLUDER COLPOPNEUMO (BALLOONS) ×3 IMPLANT
PACK GYN LAPAROSCOPIC (MISCELLANEOUS) ×3 IMPLANT
PAD OB MATERNITY 4.3X12.25 (PERSONAL CARE ITEMS) ×3 IMPLANT
PAD PREP 24X41 OB/GYN DISP (PERSONAL CARE ITEMS) ×3 IMPLANT
SCISSORS METZENBAUM CVD 33 (INSTRUMENTS) IMPLANT
SET CYSTO W/LG BORE CLAMP LF (SET/KITS/TRAYS/PACK) ×3 IMPLANT
SHEARS HARMONIC ACE PLUS 36CM (ENDOMECHANICALS) ×3 IMPLANT
SLEEVE ENDOPATH XCEL 5M (ENDOMECHANICALS) ×3 IMPLANT
SPONGE LAP 18X18 5 PK (GAUZE/BANDAGES/DRESSINGS) ×3 IMPLANT
SPONGE VERSALON 2X2 STRL (MISCELLANEOUS)
SUT VIC AB 2-0 UR6 27 (SUTURE) IMPLANT
SYR 50ML LL SCALE MARK (SYRINGE) ×3 IMPLANT
SYRINGE 10CC LL (SYRINGE) ×3 IMPLANT
TROCAR ENDO BLADELESS 11MM (ENDOMECHANICALS) ×3 IMPLANT
TROCAR XCEL NON-BLD 5MMX100MML (ENDOMECHANICALS) ×3 IMPLANT
TUBING INSUFFLATOR HEATED (MISCELLANEOUS) ×3 IMPLANT

## 2016-02-10 NOTE — Anesthesia Post-op Follow-up Note (Cosign Needed)
Anesthesia QCDR form completed.        

## 2016-02-10 NOTE — Anesthesia Postprocedure Evaluation (Signed)
Anesthesia Post Note  Patient: Sonia Carpenter  Procedure(s) Performed: Procedure(s) (LRB): HYSTERECTOMY TOTAL LAPAROSCOPIC (N/A)  Patient location during evaluation: PACU Anesthesia Type: General Level of consciousness: awake and alert Pain management: pain level controlled Vital Signs Assessment: post-procedure vital signs reviewed and stable Respiratory status: spontaneous breathing, nonlabored ventilation, respiratory function stable and patient connected to nasal cannula oxygen Cardiovascular status: blood pressure returned to baseline and stable Postop Assessment: no signs of nausea or vomiting Anesthetic complications: no     Last Vitals:  Vitals:   02/10/16 0937 02/10/16 1327  BP: 123/72 126/66  Pulse: 74 (!) 101  Resp: 16 18  Temp: 36.9 C 36.2 C    Last Pain:  Vitals:   02/10/16 1327  TempSrc:   PainSc: Asleep                 Yevette EdwardsJames G Dashia Caldeira

## 2016-02-10 NOTE — Anesthesia Procedure Notes (Signed)
Procedure Name: Intubation Date/Time: 02/10/2016 11:40 AM Performed by: Allean Found Pre-anesthesia Checklist: Patient identified, Emergency Drugs available, Suction available, Patient being monitored and Timeout performed Patient Re-evaluated:Patient Re-evaluated prior to inductionOxygen Delivery Method: Circle system utilized Preoxygenation: Pre-oxygenation with 100% oxygen Intubation Type: IV induction Ventilation: Mask ventilation without difficulty Laryngoscope Size: Mac and 3 Grade View: Grade I Tube type: Oral Tube size: 7.0 mm Number of attempts: 1 Airway Equipment and Method: Stylet Placement Confirmation: ETT inserted through vocal cords under direct vision,  positive ETCO2 and breath sounds checked- equal and bilateral Secured at: 21 cm Tube secured with: Tape Dental Injury: Teeth and Oropharynx as per pre-operative assessment

## 2016-02-10 NOTE — Op Note (Signed)
Operative Report:  PRE-OP DIAGNOSIS: chronic pelvic pain   POST-OP DIAGNOSIS: chronic pelvic pain   PROCEDURE: Procedure(s): HYSTERECTOMY, TOTAL LAPAROSCOPIC BILATERAL SALPINGECTOMY CYSTOSCOPY  SURGEON: Annamarie MajorPaul Taurean Ju, MD, FACOG  ASSISTANT: Dr Thomasene MohairStephen Jackson   ANESTHESIA: General endotracheal anesthesia  ESTIMATED BLOOD LOSS: 100 mL  SPECIMENS: Uterus, Tubes.  COMPLICATIONS: None  DISPOSITION: stable to PACU  FINDINGS: Intraabdominal adhesions were noted around the umbilicus related to prior surgery.  Normal appearing ovaries.  PROCEDURE:  The patient was taken to the OR where anesthesia was administed. She was prepped and draped in the normal sterile fashion in the dorsal lithotomy position in the [place] stirrups. A time out was performed. A Graves speculum was inserted, the cervix was grasped with a single tooth tenaculum and the endometrial cavity was sounded. The cervix was progressively dilated to a size 18 [place] with [employer] Corporation[employer]att dilators. A V-Care uterine manipulator was inserted in the usual fashion without incident. Gloves were changed and attention was turned to the abdomen.   An Left Upper Quadrant ([place]) transverse 5mm skin incision was made with the scalpel after local anesthesia applied to the skin. A Veress-step needle was inserted in the usual fashion and confirmed using the hanging drop technique. A pneumoperitoneum was obtained by insufflation of CO2 (opening pressure of 4mmHg) to 15mmHg. A diagnostic laparoscopy was performed yielding the previously described findings. Attention was turned to the left lower quadrant where after visualization of the inferior epigastric vessels a 5mm skin incision was made with the scalpel. A 5 mm laparoscopic port was inserted. The same procedure was repeated in the right lower quadrant with a 11mm trocar. Attention was turned to the left aspect of the uterus, where after visualization of the ureter, the round ligament was coagulated  and transected using the 5mm Harmonic Scapel. The anterior and posterior leafs of the broad ligament were dissected off as the anterior one was coagulated and transected in a caudal direction towards the cuff of the uterine manipulator.  Attention was then turned to the left fallopian tube which was recognized by visualization of the fimbria. The infundibulopelvic ligament and its blood vessels were carefully coagulated and transected using the Harmonic scapel.  Attention was turned to the right aspect of the uterus where the same procedure was performed.  The vesicouterine reflection of the peritoneum was dissected with the harmonic scapel and the bladder flap was created bluntly.  The uterine vessels were coagulated and transected bilaterally using first bipolar cautery and then the harmonic scapel. A 360 degree, circumferential colpotomy was done to completely amputate the uterus with cervix and tubes. Once the specimen was amputated it was delivered through the vagina.   The colpotomy was repaired in a simple interrupted ashion using a 0-Polysorb suture with an endo-stitch device.  Vaginal exam confirms complete closure.  The cavity was copiously irrigated. A survey of the pelvic cavity revealed adequate hemostasis and no injury to bowel, bladder, or ureter.   A diagnostic cystoscopy was performed using saline distension of bladder with no lesions or injuries noted.  Bilateral urine flow from each ureteral orifice is visualized.  At this point the procedure was finalized. All the instruments were removed from the patient's body. Gas was expelled and patient is leveled.  Incisions are closed with skin adhesive.    Patient goes to recovery room in stable condition.  All sponge, instrument, and needle counts are correct x2.

## 2016-02-10 NOTE — Discharge Instructions (Signed)
AMBULATORY SURGERY  DISCHARGE INSTRUCTIONS   1) The drugs that you were given will stay in your system until tomorrow so for the next 24 hours you should not:  A) Drive an automobile B) Make any legal decisions C) Drink any alcoholic beverage   2) You may resume regular meals tomorrow.  Today it is better to start with liquids and gradually work up to solid foods.  You may eat anything you prefer, but it is better to start with liquids, then soup and crackers, and gradually work up to solid foods.   3) Please notify your doctor immediately if you have any unusual bleeding, trouble breathing, redness and pain at the surgery site, drainage, fever, or pain not relieved by medication.    4) Additional Instructions:        Please contact your physician with any problems or Same Day Surgery at [phone], Monday through Friday 6 am to 4 pm, or [place] at [facility] number at [phone].AMBULATORY SURGERY  DISCHARGE INSTRUCTIONS   5) The drugs that you were given will stay in your system until tomorrow so for the next 24 hours you should not:  D) Drive an automobile E) Make any legal decisions F) Drink any alcoholic beverage   6) You may resume regular meals tomorrow.  Today it is better to start with liquids and gradually work up to solid foods.  You may eat anything you prefer, but it is better to start with liquids, then soup and crackers, and gradually work up to solid foods.   7) Please notify your doctor immediately if you have any unusual bleeding, trouble breathing, redness and pain at the surgery site, drainage, fever, or pain not relieved by medication.    8) Additional Instructions:        Please contact your physician with any problems or Same Day Surgery at [phone], Monday through Friday 6 am to 4 pm, or [place] at [facility] number at [phone].Total Laparoscopic Hysterectomy, Care After Refer to this sheet in the next few  weeks. These instructions provide you with information on caring for yourself after your procedure. Your health care provider may also give you more specific instructions. Your treatment has been planned according to current medical practices, but problems sometimes occur. Call your health care provider if you have any problems or questions after your procedure. What can I expect after the procedure?  Pain and bruising at the incision sites. You will be given pain medicine to control it.  Menopausal symptoms such as hot flashes, night sweats, and insomnia if your ovaries were removed.  Sore throat from the breathing tube that was inserted during surgery. Follow these instructions at home:  Only take over-the-counter or prescription medicines for pain, discomfort, or fever as directed by your health care provider.  Do not take aspirin. It can cause bleeding.  Do not drive when taking pain medicine.  Follow your health care provider's advice regarding diet, exercise, lifting, driving, and general activities.  Resume your usual diet as directed and allowed.  Get plenty of rest and sleep.  Do not douche, use tampons, or have sexual intercourse for at least 6 weeks, or until your health care provider gives you permission.  Change your bandages (dressings) as directed by your health care provider.  Monitor your temperature and notify your health care provider of a fever.  Take showers instead of baths for 2-3 weeks.  Do not drink alcohol until your health care provider gives you  permission.  If you develop constipation, you may take a mild laxative with your health care provider's permission. Bran foods may help with constipation problems. Drinking enough fluids to keep your urine clear or pale yellow may help as well.  Try to have someone home with you for 1-2 weeks to help around the house.  Keep all of your follow-up appointments as directed by your health care provider. Contact a  health care provider if:  You have swelling, redness, or increasing pain around your incision sites.  You have pus coming from your incision.  You notice a bad smell coming from your incision.  Your incision breaks open.  You feel dizzy or lightheaded.  You have pain or bleeding when you urinate.  You have persistent diarrhea.  You have persistent nausea and vomiting.  You have abnormal vaginal discharge.  You have a rash.  You have any type of abnormal reaction or develop an allergy to your medicine.  You have poor pain control with your prescribed medicine. Get help right away if:  You have chest pain or shortness of breath.  You have severe abdominal pain that is not relieved with pain medicine.  You have pain or swelling in your legs. This information is not intended to replace advice given to you by your health care provider. Make sure you discuss any questions you have with your health care provider. Document Released: 10/25/2012 Document Revised: 06/12/2015 Document Reviewed: 07/25/2012 Elsevier Interactive Patient Education  2017 [employer]

## 2016-02-10 NOTE — H&P (Signed)
History and Physical Interval Note:  02/10/2016 10:18 AM  Sonia MontaneAshley N Wilson  has presented today for surgery, with the diagnosis of chronic pelvic pain  The various methods of treatment have been discussed with the patient and family. After consideration of risks, benefits and other options for treatment, the patient has consented to  Procedure(s): HYSTERECTOMY TOTAL LAPAROSCOPIC (N/A) and BILATERAL SALPINGECTOMY with CYSTOSCOPY as a surgical intervention .  The patient's history has been reviewed, patient examined, no change in status, stable for surgery.  Pt has the following beta blocker history-  Not taking Beta Blocker.  I have reviewed the patient's chart and labs.  Questions were answered to the patient's satisfaction.    [name]

## 2016-02-10 NOTE — Anesthesia Preprocedure Evaluation (Signed)
Anesthesia Evaluation  Patient identified by MRN, date of birth, ID band Patient awake    Reviewed: Allergy & Precautions, H&P , NPO status , Patient's Chart, lab work & pertinent test results, reviewed documented beta blocker date and time   Airway Mallampati: III  TM Distance: >3 FB Neck ROM: full    Dental  (+) Teeth Intact   Pulmonary neg pulmonary ROS,    Pulmonary exam normal        Cardiovascular negative cardio ROS Normal cardiovascular exam Rhythm:regular Rate:Normal     Neuro/Psych  Headaches, negative neurological ROS  negative psych ROS   GI/Hepatic negative GI ROS, Neg liver ROS, GERD  Medicated,  Endo/Other  negative endocrine ROSMorbid obesity  Renal/GU negative Renal ROS  negative genitourinary   Musculoskeletal   Abdominal   Peds  Hematology negative hematology ROS (+)   Anesthesia Other Findings Past Medical History: No date: GERD (gastroesophageal reflux disease) No date: Headache 2014: Hernia Past Surgical History: 10-03-12: HERNIA REPAIR  May 2014: TUBAL LIGATION BMI    Body Mass Index:  42.05 kg/m     Reproductive/Obstetrics negative OB ROS                             Anesthesia Physical Anesthesia Plan  ASA: III  Anesthesia Plan: General ETT   Post-op Pain Management:    Induction:   Airway Management Planned:   Additional Equipment:   Intra-op Plan:   Post-operative Plan:   Informed Consent: I have reviewed the patients History and Physical, chart, labs and discussed the procedure including the risks, benefits and alternatives for the proposed anesthesia with the patient or authorized representative who has indicated his/her understanding and acceptance.   Dental Advisory Given  Plan Discussed with: CRNA  Anesthesia Plan Comments:         Anesthesia Quick Evaluation

## 2016-02-10 NOTE — Progress Notes (Signed)
Pt states pain somewhat better   No drainage from abdominal port sites x3    Peri pad clean and dry

## 2016-02-10 NOTE — Transfer of Care (Signed)
Immediate Anesthesia Transfer of Care Note  Patient: Sonia Carpenter  Procedure(s) Performed: Procedure(s): HYSTERECTOMY TOTAL LAPAROSCOPIC (N/A)  Patient Location: PACU  Anesthesia Type:General  Level of Consciousness: sedated  Airway & Oxygen Therapy: Patient Spontanous Breathing and Patient connected to face mask oxygen  Post-op Assessment: Report given to RN and Post -op Vital signs reviewed and stable  Post vital signs: Reviewed and stable  Last Vitals:  Vitals:   02/10/16 0937 02/10/16 1327  BP: 123/72 (P) 126/66  Pulse: 74   Resp: 16   Temp: 36.9 C (P) 36.2 C    Last Pain:  Vitals:   02/10/16 0937  TempSrc: Tympanic  PainSc: 5          Complications: No apparent anesthesia complications

## 2016-02-11 ENCOUNTER — Encounter: Payer: Self-pay | Admitting: Obstetrics & Gynecology

## 2016-02-11 LAB — SURGICAL PATHOLOGY

## 2016-03-24 ENCOUNTER — Ambulatory Visit: Payer: Medicaid Other | Admitting: Obstetrics & Gynecology

## 2016-06-24 ENCOUNTER — Encounter: Payer: Self-pay | Admitting: Emergency Medicine

## 2016-06-24 ENCOUNTER — Emergency Department
Admission: EM | Admit: 2016-06-24 | Discharge: 2016-06-24 | Disposition: A | Discharge status: Home / Self Care | Payer: Medicaid Other | Attending: Emergency Medicine | Admitting: Emergency Medicine

## 2016-06-24 DIAGNOSIS — Z79899 Other long term (current) drug therapy: Secondary | ICD-10-CM | POA: Diagnosis not present

## 2016-06-24 DIAGNOSIS — R51 Headache: Secondary | ICD-10-CM | POA: Diagnosis present

## 2016-06-24 DIAGNOSIS — G43801 Other migraine, not intractable, with status migrainosus: Secondary | ICD-10-CM | POA: Diagnosis not present

## 2016-06-24 HISTORY — DX: Migraine, unspecified, not intractable, without status migrainosus: G43.909

## 2016-06-24 MED ORDER — DIPHENHYDRAMINE HCL 50 MG/ML IJ SOLN
INTRAMUSCULAR | Status: AC
  Filled 2016-06-24: qty 1

## 2016-06-24 MED ORDER — KETOROLAC TROMETHAMINE 30 MG/ML IJ SOLN
15.0000 mg | Freq: Once | INTRAMUSCULAR | Status: AC
  Administered 2016-06-24: 15 mg via INTRAVENOUS

## 2016-06-24 MED ORDER — SODIUM CHLORIDE 0.9 % IV BOLUS (SEPSIS)
1000.0000 mL | Freq: Once | INTRAVENOUS | Status: AC
  Administered 2016-06-24: 1000 mL via INTRAVENOUS

## 2016-06-24 MED ORDER — DIPHENHYDRAMINE HCL 50 MG/ML IJ SOLN
25.0000 mg | Freq: Once | INTRAMUSCULAR | Status: AC
  Administered 2016-06-24: 25 mg via INTRAVENOUS

## 2016-06-24 MED ORDER — METOCLOPRAMIDE HCL 5 MG/ML IJ SOLN
10.0000 mg | Freq: Once | INTRAMUSCULAR | Status: AC
  Administered 2016-06-24: 10 mg via INTRAVENOUS

## 2016-06-24 MED ORDER — METOCLOPRAMIDE HCL 5 MG/ML IJ SOLN
INTRAMUSCULAR | Status: AC
  Filled 2016-06-24: qty 2

## 2016-06-24 MED ORDER — KETOROLAC TROMETHAMINE 30 MG/ML IJ SOLN
INTRAMUSCULAR | Status: AC
  Filled 2016-06-24: qty 1

## 2016-06-24 NOTE — ED Notes (Signed)
Pt presents with headache x 3 days; hx of migraines. States they have been less often lately, but she states imitrex has not helped this time. Pt alert & oriented, in dark room upon assessment.

## 2016-06-24 NOTE — ED Notes (Signed)
Pt iv stopped for restroom break

## 2016-06-24 NOTE — ED Provider Notes (Signed)
[facility] Emergency Department Provider Note  ____________________________________________  Time seen: Approximately 4:25 PM  I have reviewed the triage vital signs and the nursing notes.   HISTORY  Chief Complaint Migraine   HPI Sonia Carpenter is a 31 y.o. female with a history of migraine headaches who presents for evaluation of a headache 3 days. Patient reports headaches identical to her prior migraines. She usually takes Imitrex at home which helps with her migraines. This time he has not been helpful. The headache starts in the occipital region and radiates to the rest of her hand, currently 10 out of 10, associated with photophobia, nausea and vomiting. Patient denies changes in vision, weakness or numbness, sudden onset HA or HA with maximal intensity at onset, fever, neck stiffness, history of immunosuppression, Jaw claudication, muscle aches, temporal artery pain, history of other household members with similar symptoms, pregnancy, clotting disorder, trauma, eye pain, recent cervical manipulation, or dizziness with headache.     Past Medical History:  Diagnosis Date  . GERD (gastroesophageal reflux disease)   . Headache   . Hernia 2014  . Migraine     Patient Active Problem List   Diagnosis Date Noted  . Migraine headache without aura 11/25/2014  . Amenorrhea 11/25/2014  . Umbilical hernia without mention of obstruction or gangrene 10/12/2012    Past Surgical History:  Procedure Laterality Date  . HERNIA REPAIR  10-03-12  . LAPAROSCOPIC HYSTERECTOMY N/A 02/10/2016   Procedure: HYSTERECTOMY TOTAL LAPAROSCOPIC;  Surgeon: Nadara Mustard, MD;  Location: [facility] ORS;  Service: Gynecology;  Laterality: N/A;  . TUBAL LIGATION   May 2014    Prior to Admission medications   Medication Sig Start Date End Date Taking? Authorizing Provider  oxyCODONE-acetaminophen (PERCOCET) 5-325 MG tablet Take 1 tablet by mouth every 4 (four) hours as needed for  moderate pain or severe pain. 02/10/16   Nadara Mustard, MD  SUMAtriptan (IMITREX) 100 MG tablet Take 1 tablet (100 mg total) by mouth once. May repeat in 2 hours if headache persists or recurs. 06/11/15 06/10/16  Reubin Milan, MD  triamcinolone cream (KENALOG) 0.1 % Apply 1 application topically 3 (three) times daily. Patient taking differently: Apply 1 application topically 3 (three) times daily as needed (for rash/irritated skin).  01/16/16   Reubin Milan, MD    Allergies Patient has no known allergies.  Family History  Problem Relation Age of Onset  . Diabetes Maternal Grandmother     Social History Social History  Substance Use Topics  . Smoking status: Never Smoker  . Smokeless tobacco: Never Used  . Alcohol use 1.2 oz/week    2 Standard drinks or equivalent per week     Comment: occasionally    Review of Systems Constitutional: Negative for fever. Eyes: Negative for visual changes. ENT: Negative for sore throat. Neck: No neck pain  Cardiovascular: Negative for chest pain. Respiratory: Negative for shortness of breath. Gastrointestinal: Negative for abdominal pain,  Diarrhea. + N/V Genitourinary: Negative for dysuria. Musculoskeletal: Negative for back pain. Skin: Negative for rash. Neurological: Negative for weakness or numbness. + HA Psych: No SI or HI  ____________________________________________   PHYSICAL EXAM:  VITAL SIGNS: ED Triage Vitals  Enc Vitals Group     BP 06/24/16 1204 122/65     Pulse Rate 06/24/16 1204 78     Resp 06/24/16 1204 18     Temp 06/24/16 1204 98.3 F (36.8 C)     Temp Source 06/24/16 1204  Oral     SpO2 06/24/16 1204 99 %     Weight 06/24/16 1205 247 lb (112 kg)     Height 06/24/16 1205 5\' 4"  (1.626 m)     Head Circumference --      Peak Flow --      Pain Score 06/24/16 1207 10     Pain Loc --      Pain Edu? --      Excl. in GC? --     Constitutional: Alert and oriented, laying in a dark room.  HEENT:       Head: Normocephalic and atraumatic.         Eyes: Conjunctivae are normal. Sclera is non-icteric.       Mouth/Throat: Mucous membranes are moist.       Neck: Supple with no signs of meningismus. Cardiovascular: Regular rate and rhythm. No murmurs, gallops, or rubs. 2+ symmetrical distal pulses are present in all extremities. No JVD. Respiratory: Normal respiratory effort. Lungs are clear to auscultation bilaterally. No wheezes, crackles, or rhonchi.  Gastrointestinal: Soft, non tender, and non distended with positive bowel sounds. No rebound or guarding. Genitourinary: No CVA tenderness. Musculoskeletal: Nontender with normal range of motion in all extremities. No edema, cyanosis, or erythema of extremities. Neurologic: Normal speech and language. A & O x3, PERRL, no nystagmus, CN II-XII intact, motor testing reveals good tone and bulk throughout. There is no evidence of pronator drift or dysmetria. Muscle strength is 5/5 throughout. Deep tendon reflexes are 2+ throughout with downgoing toes. Sensory examination is intact. Gait is normal. Skin: Skin is warm, dry and intact. No rash noted. Psychiatric: Mood and affect are normal. Speech and behavior are normal.  ____________________________________________   LABS (all labs ordered are listed, but only abnormal results are displayed)  Labs Reviewed - No data to display ____________________________________________  EKG  none  ____________________________________________  RADIOLOGY  none  ____________________________________________   PROCEDURES  Procedure(s) performed: None Procedures Critical Care performed:  None ____________________________________________   INITIAL IMPRESSION / ASSESSMENT AND PLAN / ED COURSE  31 y.o. female with a history of migraine headaches who presents for evaluation of a headache 3 days.   Low suspicion for more serious or life threatening etiology of HA based on history and exam. No sudden onset  thunderclap HA, onset with exertion, vomiting, focal neurologic deficits, to suggest increased risk of subarachnoid hemorrhage. No fever, neck pain, neck stiffness, or meningismus on exam to suggest meningitis. No fevers, altered mental status, unusual behavior to suggest encephalitis. No focal neurologic deficits by history or exam to suggest central venous thrombosis. No constitutional symptoms including fever, fatigue, weight loss, temporal scalp tenderness, jaw claudication, visual loss, to suggest temporal arteritis. No immunocompromise to suggest increased risk for intracranial infectious disease. No visual changes or findings on ocular exam to suggest acute angle closure glaucoma. No reports of toxic exposures including carbon monoxide or other household members with similar symptoms.  Pregnancy test not done because patient is status post hysterectomy. We'll treat with IV Reglan, Benadryl, Toradol, fluids and reassess.   ----------------------------------------- 3:20 PM on 06/24/2016 -----------------------------------------   OBSERVATION CARE: This patient is being placed under observation care for the following reasons: Headache patients requiring repeat treatments and serial exams to determine if they  improve with treatment  ----------------------------------------- 4:20 PM on 06/24/2016 -----------------------------------------  Patient's HA improving, remains well appearing.   ----------------------------------------- 4:44 PM on 06/24/2016 -----------------------------------------   END OF OBSERVATION STATUS: After an appropriate period of observation,  this patient is being discharged due to the following reason(s):  Patient is requesting discharge. Headache is fully resolved. Patient is sitting up smiling and talking to her family members, looks extremely well appearing and is very thankful for the care she received. Patients can be discharged home with supportive care and  follow-up with PCP.      Pertinent labs & imaging results that were available during my care of the patient were reviewed by me and considered in my medical decision making (see chart for details).    ____________________________________________   FINAL CLINICAL IMPRESSION(S) / ED DIAGNOSES  Final diagnoses:  Other migraine with status migrainosus, not intractable      NEW MEDICATIONS STARTED DURING THIS VISIT:  New Prescriptions   No medications on file     Note:  This document was prepared using Dragon voice recognition software and may include unintentional dictation errors.    Don PerkingVeronese, [place], MD 06/24/16 [phone]

## 2016-06-24 NOTE — ED Notes (Signed)
Pt discharged home after verbalizing understanding of discharge instructions; nad noted. 

## 2016-06-24 NOTE — ED Triage Notes (Signed)
Migraine since Tuesday.  History of same. Usually takes imitrex but it did not work this time.

## 2016-10-18 ENCOUNTER — Other Ambulatory Visit: Payer: Self-pay | Admitting: Internal Medicine

## 2016-10-18 DIAGNOSIS — G43009 Migraine without aura, not intractable, without status migrainosus: Secondary | ICD-10-CM

## 2016-10-27 ENCOUNTER — Ambulatory Visit: Payer: Self-pay | Admitting: Internal Medicine

## 2018-12-01 ENCOUNTER — Emergency Department: Payer: Self-pay

## 2018-12-01 ENCOUNTER — Emergency Department
Admission: EM | Admit: 2018-12-01 | Discharge: 2018-12-01 | Disposition: A | Discharge status: Home / Self Care | Payer: Self-pay | Attending: Emergency Medicine | Admitting: Emergency Medicine

## 2018-12-01 ENCOUNTER — Encounter: Payer: Self-pay | Admitting: Emergency Medicine

## 2018-12-01 ENCOUNTER — Other Ambulatory Visit: Payer: Self-pay

## 2018-12-01 DIAGNOSIS — S93401A Sprain of unspecified ligament of right ankle, initial encounter: Secondary | ICD-10-CM | POA: Insufficient documentation

## 2018-12-01 DIAGNOSIS — Y9389 Activity, other specified: Secondary | ICD-10-CM | POA: Insufficient documentation

## 2018-12-01 DIAGNOSIS — X501XXA Overexertion from prolonged static or awkward postures, initial encounter: Secondary | ICD-10-CM | POA: Insufficient documentation

## 2018-12-01 DIAGNOSIS — Y999 Unspecified external cause status: Secondary | ICD-10-CM | POA: Insufficient documentation

## 2018-12-01 DIAGNOSIS — Z79899 Other long term (current) drug therapy: Secondary | ICD-10-CM | POA: Insufficient documentation

## 2018-12-01 DIAGNOSIS — Y929 Unspecified place or not applicable: Secondary | ICD-10-CM | POA: Insufficient documentation

## 2018-12-01 MED ORDER — IBUPROFEN 600 MG PO TABS: 600.0000 mg | ORAL_TABLET | Freq: Three times a day (TID) | ORAL | 0 refills | Status: AC | PRN

## 2018-12-01 MED ORDER — IBUPROFEN 600 MG PO TABS
600.0000 mg | ORAL_TABLET | Freq: Once | ORAL | Status: AC
Start: 2018-12-01 — End: 2018-12-01
  Administered 2018-12-01: 12:00:00 600 mg via ORAL
  Filled 2018-12-01: qty 1

## 2018-12-01 MED ORDER — OXYCODONE-ACETAMINOPHEN 7.5-325 MG PO TABS: 1.0000 | ORAL_TABLET | Freq: Four times a day (QID) | ORAL | 0 refills | Status: AC | PRN

## 2018-12-01 MED ORDER — OXYCODONE-ACETAMINOPHEN 5-325 MG PO TABS
1.0000 | ORAL_TABLET | Freq: Once | ORAL | Status: AC
  Administered 2018-12-01: 12:00:00 1 via ORAL
  Filled 2018-12-01: qty 1

## 2018-12-01 NOTE — ED Triage Notes (Signed)
Pt reports last pm she went to get up and her leg was asleep and she fell hurting her right ankle.

## 2018-12-01 NOTE — ED Provider Notes (Signed)
[name] Emergency Department Provider Note   ____________________________________________   First MD Initiated Contact with Patient 12/01/18 1158     (approximate)  I have reviewed the triage vital signs and the nursing notes.   HISTORY  Chief Complaint Ankle Pain    HPI Sonia Carpenter is a 33 y.o. female patient complain of left ankle pain secondary to a twisting incident last night.  Patient had leg fell asleep and when she stood up she twisted her ankle during the fall.  Patient denies LOC or head injury.  Patient rates the pain as a 6/10.  Patient described the pain as "aching".  Patient did pain increased with weightbearing.         Past Medical History:  Diagnosis Date  . GERD (gastroesophageal reflux disease)   . Headache   . Hernia 2014  . Migraine     Patient Active Problem List   Diagnosis Date Noted  . Migraine headache without aura 11/25/2014  . Amenorrhea 11/25/2014  . Umbilical hernia without mention of obstruction or gangrene 10/12/2012    Past Surgical History:  Procedure Laterality Date  . HERNIA REPAIR  10-03-12  . LAPAROSCOPIC HYSTERECTOMY N/A 02/10/2016   Procedure: HYSTERECTOMY TOTAL LAPAROSCOPIC;  Surgeon: Nadara Mustardobert P Harris, MD;  Location: [facility] ORS;  Service: Gynecology;  Laterality: N/A;  . TUBAL LIGATION   May 2014    Prior to Admission medications   Medication Sig Start Date End Date Taking? Authorizing Provider  ibuprofen (ADVIL) 600 MG tablet Take 1 tablet (600 mg total) by mouth every 8 (eight) hours as needed. 12/01/18   Joni ReiningSmith,  K, PA-C  oxyCODONE-acetaminophen (PERCOCET) 7.5-325 MG tablet Take 1 tablet by mouth every 6 (six) hours as needed. 12/01/18   Joni ReiningSmith,  K, PA-C  SUMAtriptan (IMITREX) 100 MG tablet TAKE 1 TABLET BY MOUTH ONCE AT ONSET OF HEADACHE. MAY REPEAT IN 2 HOURS IF HEADACHE PERSISTS OR RECURS. 10/18/16   Reubin MilanBerglund, Laura H, MD  triamcinolone cream (KENALOG) 0.1 % Apply 1  application topically 3 (three) times daily. Patient taking differently: Apply 1 application topically 3 (three) times daily as needed (for rash/irritated skin).  01/16/16   Reubin MilanBerglund, Laura H, MD    Allergies Patient has no known allergies.  Family History  Problem Relation Age of Onset  . Diabetes Maternal Grandmother     Social History Social History   Tobacco Use  . Smoking status: Never Smoker  . Smokeless tobacco: Never Used  Substance Use Topics  . Alcohol use: Yes    Alcohol/week: 2.0 standard drinks    Types: 2 Standard drinks or equivalent per week    Comment: occasionally  . Drug use: No    Review of Systems  Constitutional: No fever/chills Eyes: No visual changes. ENT: No sore throat. Cardiovascular: Denies chest pain. Respiratory: Denies shortness of breath. Gastrointestinal: No abdominal pain.  No nausea, no vomiting.  No diarrhea.  No constipation. Genitourinary: Negative for dysuria. Musculoskeletal: Right ankle pain. Skin: Negative for rash. Neurological: Negative for headaches, focal weakness or numbness.   ____________________________________________   PHYSICAL EXAM:  VITAL SIGNS: ED Triage Vitals  Enc Vitals Group     BP --      Pulse --      Resp --      Temp 12/01/18 1059 98.1 F (36.7 C)     Temp Source 12/01/18 1059 Oral     SpO2 --      Weight 12/01/18 1100 250 lb (113.4  kg)     Height 12/01/18 1100 5\' 4"  (1.626 m)     Head Circumference --      Peak Flow --      Pain Score 12/01/18 1100 6     Pain Loc --      Pain Edu? --      Excl. in GC? --     Constitutional: Alert and oriented. Well appearing and in no acute distress. Cardiovascular: Normal rate, regular rhythm. Grossly normal heart sounds.  Good peripheral circulation. Respiratory: Normal respiratory effort.  No retractions. Lungs CTAB. Gastrointestinal: Soft and nontender. No distention. No abdominal bruits. No CVA tenderness. Genitourinary: Deferred Musculoskeletal:  No obvious deformity to the right ankle.  Patient has diffuse edema.  Patient is moderate guarding palpation lateral aspect of the ankle.  Skin:  Skin is warm, dry and intact. No rash noted.  No abrasion or ecchymosis. Psychiatric: Mood and affect are normal. Speech and behavior are normal.  ____________________________________________   LABS (all labs ordered are listed, but only abnormal results are displayed)  Labs Reviewed - No data to display ____________________________________________  EKG   ____________________________________________  RADIOLOGY  ED MD interpretation:    Official radiology report(s): Dg Ankle Complete Right  Result Date: 12/01/2018 CLINICAL DATA:  Pain after fall EXAM: RIGHT ANKLE - COMPLETE 3+ VIEW COMPARISON:  None. FINDINGS: Soft tissue swelling is present at the ankle. There is no acute fracture. Alignment is anatomic. Joint spaces are preserved. Probable tibiotalar joint effusion. IMPRESSION: No acute fracture or malalignment. Probable tibiotalar joint effusion. Electronically Signed   By: 12/03/2018 M.D.   On: 12/01/2018 11:52    ____________________________________________   PROCEDURES  Procedure(s) performed (including Critical Care):  Procedures   ____________________________________________   INITIAL IMPRESSION / ASSESSMENT AND PLAN / ED COURSE  As part of my medical decision making, I reviewed the following data within the electronic MEDICAL RECORD NUMBER         Sonia Carpenter was evaluated in Emergency Department on 12/01/2018 for the symptoms described in the history of present illness. She was evaluated in the context of the global COVID-19 pandemic, which necessitated consideration that the patient might be at risk for infection with the SARS-CoV-2 virus that causes COVID-19. Institutional protocols and algorithms that pertain to the evaluation of patients at risk for COVID-19 are in a state of rapid change based on  information released by regulatory bodies including the CDC and federal and state organizations. These policies and algorithms were followed during the patient's care in the ED.  Patient presents with right ankle pain secondary to a twisting incident last night.  Discussed x-ray findings with patient.  Patient placed in ankle stirrup splint and given crutches to assist with ambulation.  Patient given discharge care instruction advised and transfixed medication.  Patient advised follow-up PCP if there is no improvement in 3 to 5 days.      ____________________________________________   FINAL CLINICAL IMPRESSION(S) / ED DIAGNOSES  Final diagnoses:  Sprain of right ankle, unspecified ligament, initial encounter     ED Discharge Orders         Ordered    oxyCODONE-acetaminophen (PERCOCET) 7.5-325 MG tablet  Every 6 hours PRN     12/01/18 1204    ibuprofen (ADVIL) 600 MG tablet  Every 8 hours PRN     12/01/18 1204           Note:  This document was prepared using Dragon voice recognition software and may include  unintentional dictation errors.    Sable Feil, PA-C 12/01/18 1209    Arta Silence, MD 12/01/18 [phone]

## 2018-12-01 NOTE — ED Notes (Signed)
See triage note  Presents with pain to right ankle  States she fell when she got up  Pain with some swelling to ankle  Good pulses

## 2018-12-01 NOTE — Discharge Instructions (Addendum)
Wear splint and ambulate with support for 3 to 5 days as needed.  Be advised medication may cause drowsiness.

## 2020-05-27 IMAGING — DX DG ANKLE COMPLETE 3+V*R*
3 series · 3 of 3 positions shown · non-contrast
Comparison: None.

CLINICAL DATA: Pain after fall

EXAM:
RIGHT ANKLE - COMPLETE 3+ VIEW

[ankle ap]
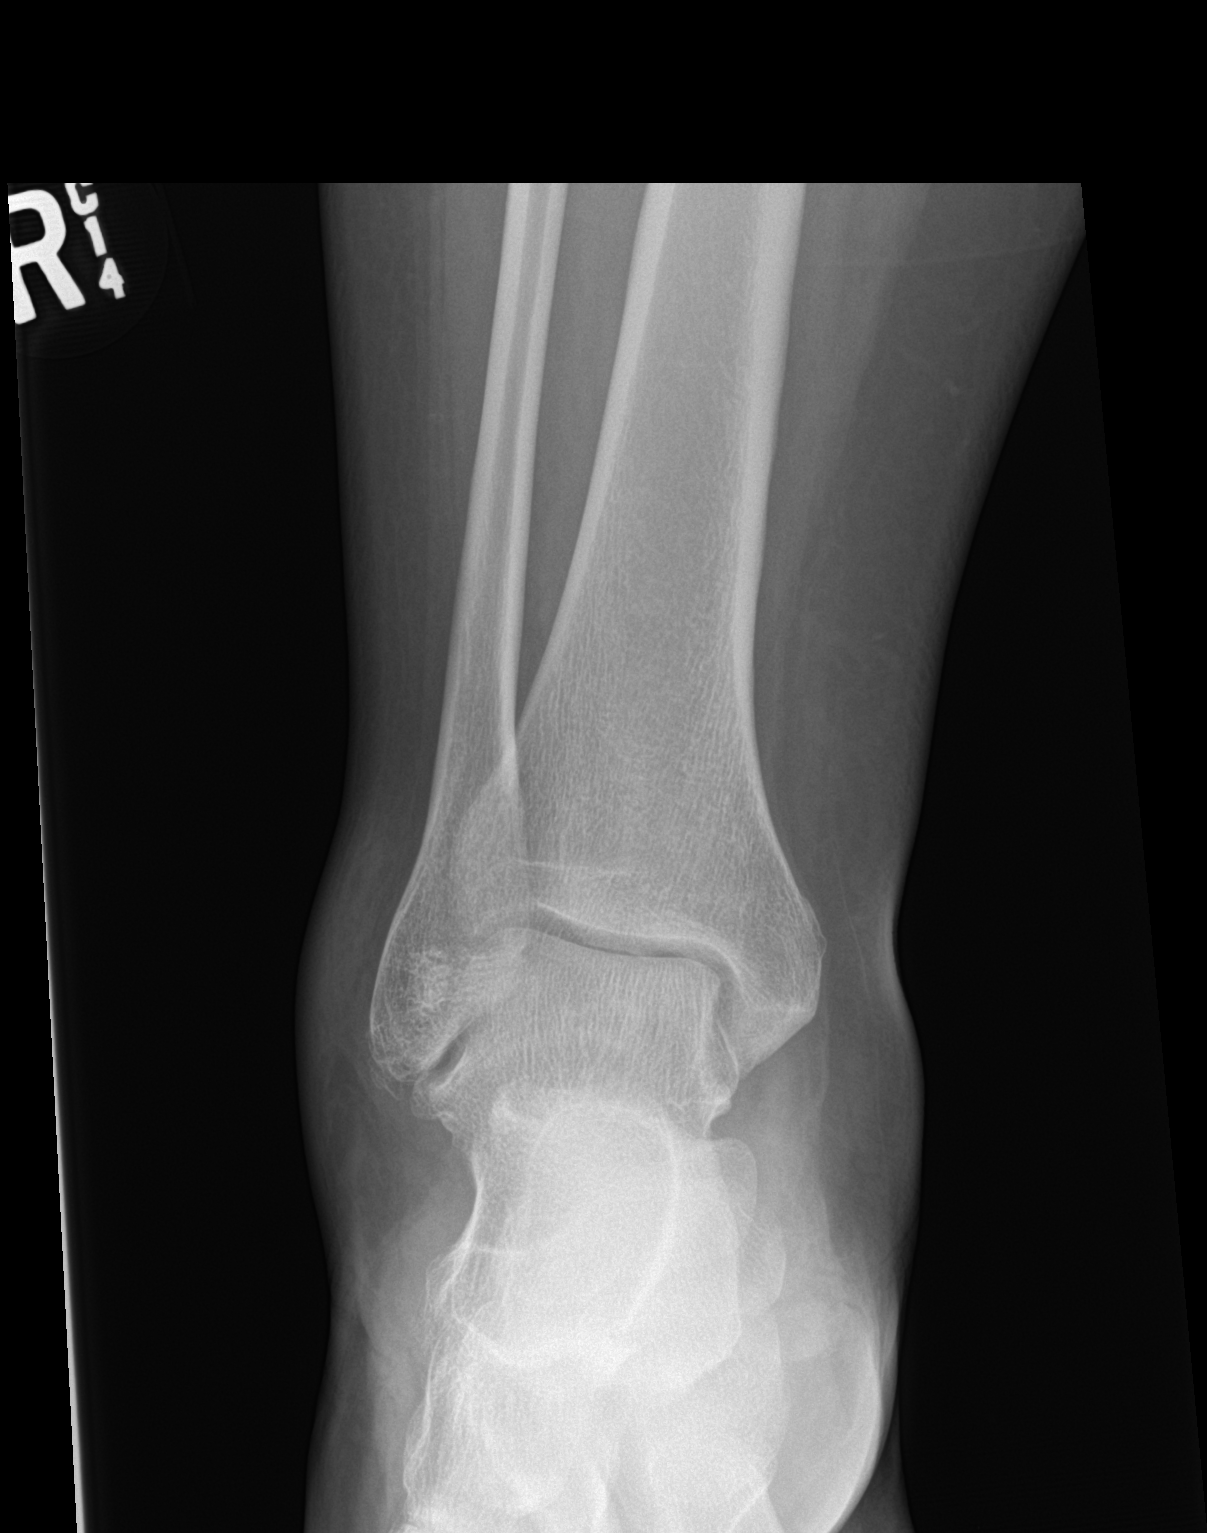

[ankle obl]
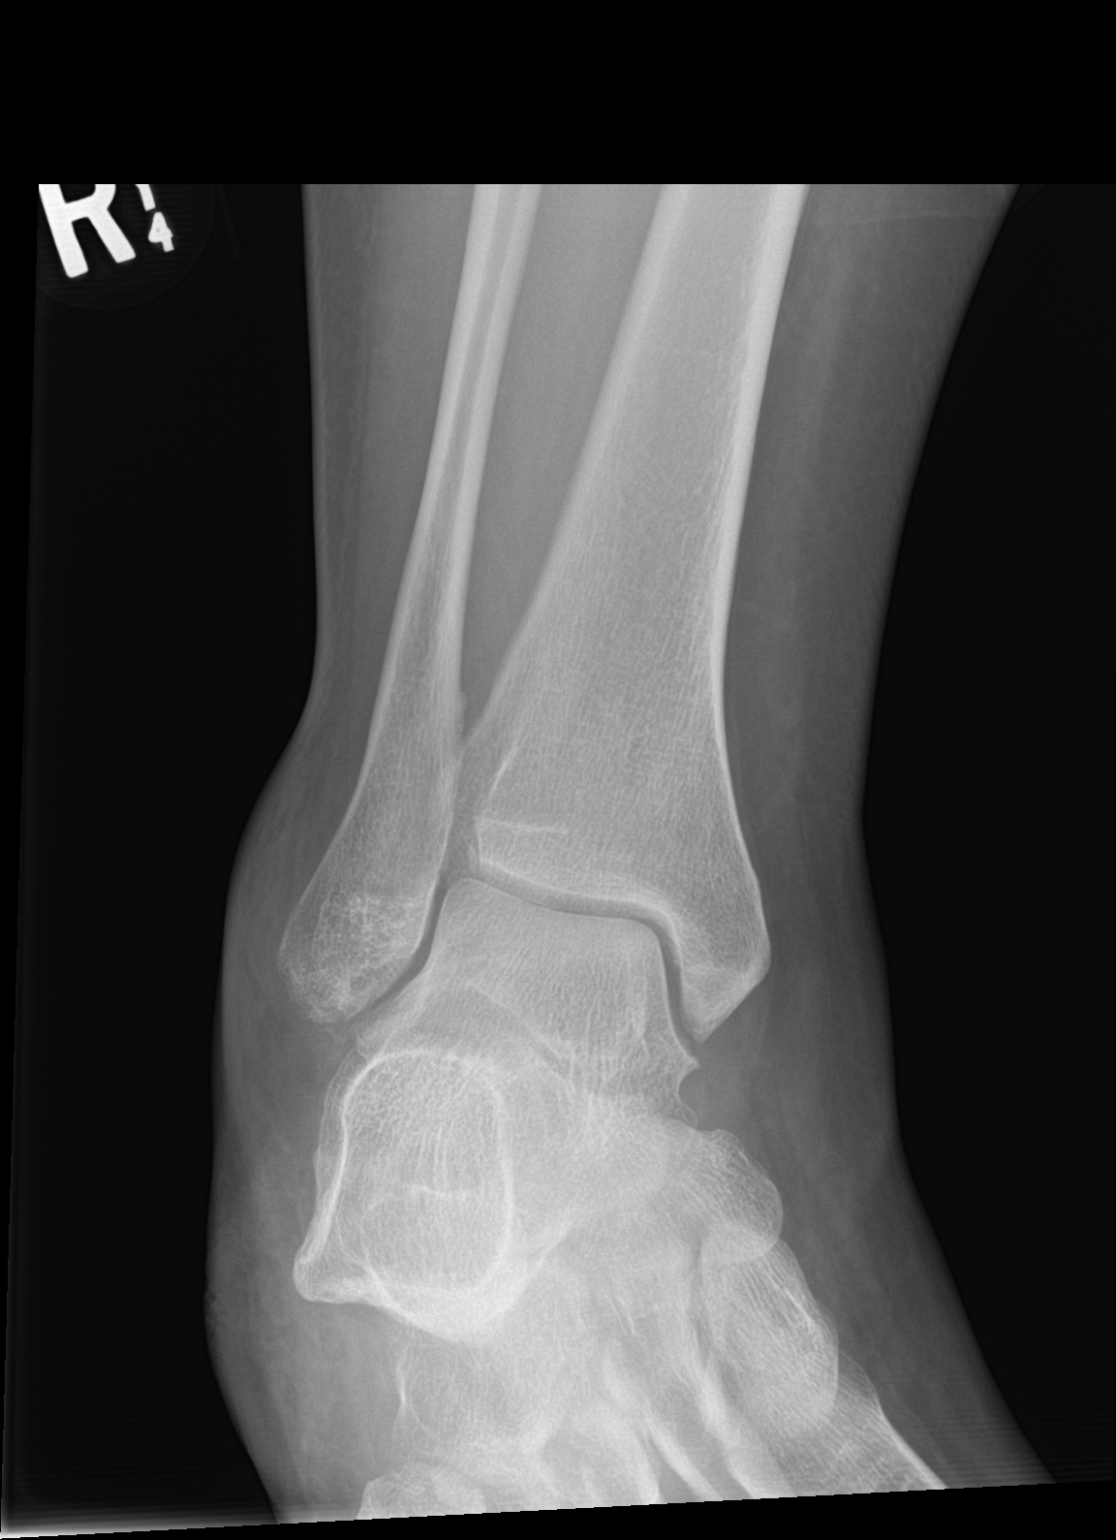

[ankle lat]
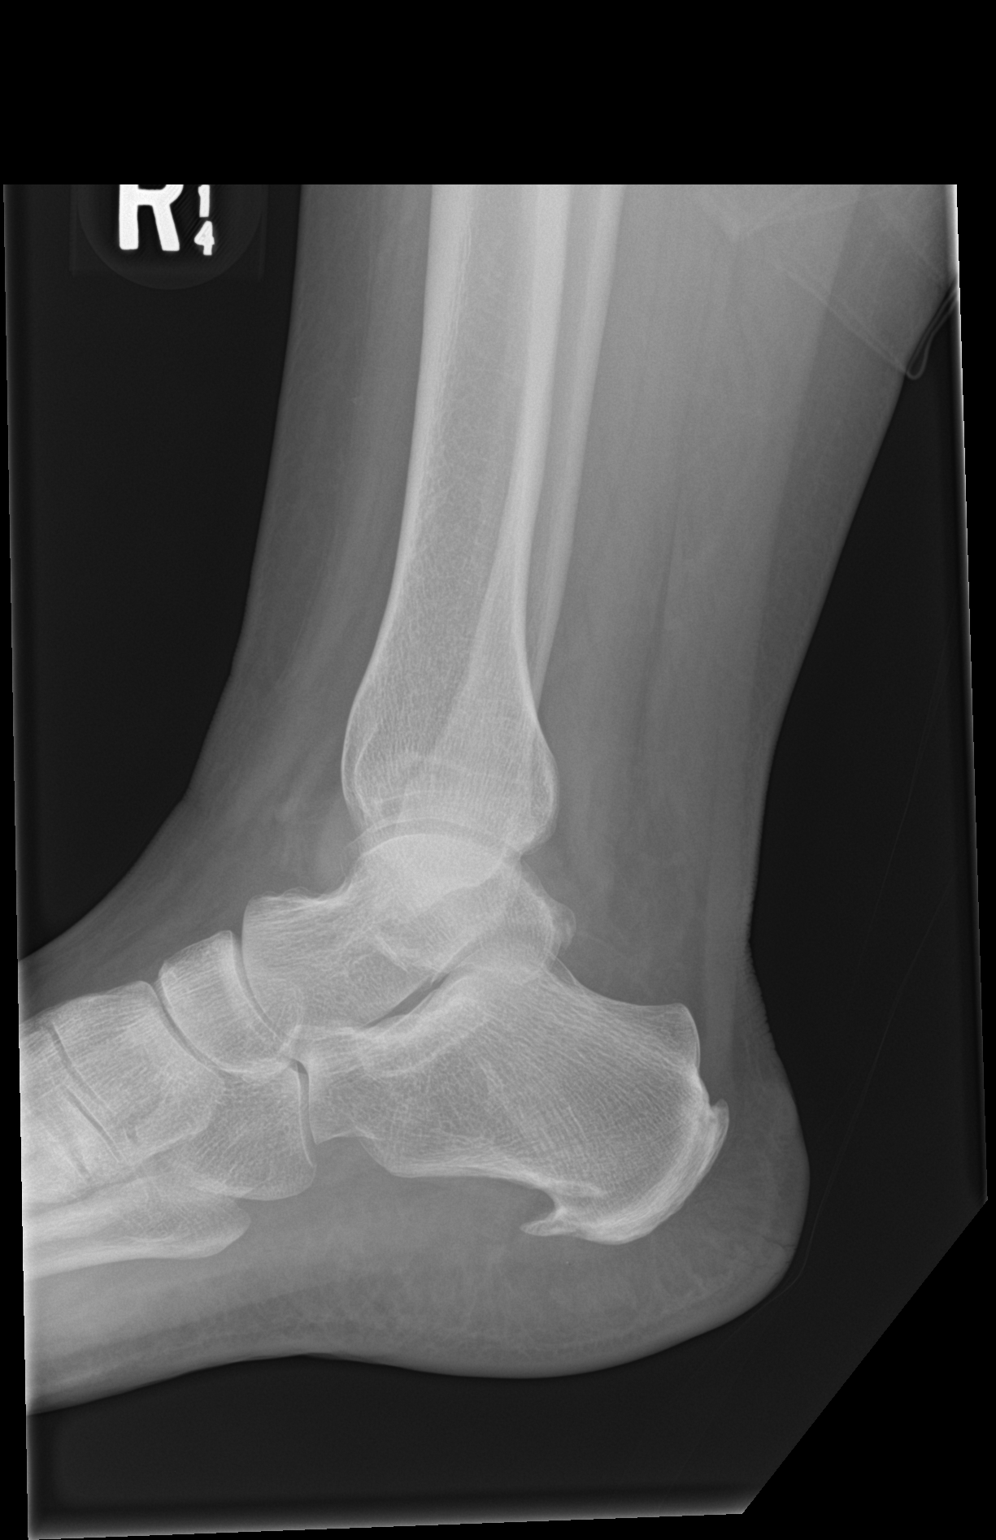

[3 of 3 positions shown; findings below may reference images not displayed]

FINDINGS: Soft tissue swelling is present at the ankle. There is no acute
fracture. Alignment is anatomic. Joint spaces are preserved.
Probable tibiotalar joint effusion.
IMPRESSION: No acute fracture or malalignment. Probable tibiotalar joint
effusion.
